# Patient Record
Sex: Female | Born: 1975
Health system: Southern US, Community
[De-identification: ages and names within clinical notes are randomized; demographics above are authoritative.]

## PROBLEM LIST (undated history)

## (undated) DIAGNOSIS — L309 Dermatitis, unspecified: Secondary | ICD-10-CM

## (undated) DIAGNOSIS — R51 Headache: Secondary | ICD-10-CM

## (undated) DIAGNOSIS — J302 Other seasonal allergic rhinitis: Secondary | ICD-10-CM

## (undated) DIAGNOSIS — K429 Umbilical hernia without obstruction or gangrene: Secondary | ICD-10-CM

## (undated) DIAGNOSIS — D649 Anemia, unspecified: Secondary | ICD-10-CM

## (undated) DIAGNOSIS — J45909 Unspecified asthma, uncomplicated: Secondary | ICD-10-CM

## (undated) DIAGNOSIS — R519 Headache, unspecified: Secondary | ICD-10-CM

## (undated) HISTORY — PX: WISDOM TOOTH EXTRACTION: SHX21

## (undated) HISTORY — PX: DILATION AND CURETTAGE OF UTERUS: SHX78

## (undated) HISTORY — PX: COLONOSCOPY: SHX174

## (undated) HISTORY — PX: MYOMECTOMY VAGINAL APPROACH: SUR871

## (undated) HISTORY — PX: ABDOMINAL HYSTERECTOMY: SHX81

## (undated) HISTORY — PX: BREAST SURGERY: SHX581

## (undated) HISTORY — DX: Unspecified asthma, uncomplicated: J45.909

## (undated) HISTORY — PX: LIPOSUCTION TRUNK: SUR833

## (undated) HISTORY — DX: Dermatitis, unspecified: L30.9

---

## 2013-12-07 HISTORY — PX: LASER ABLATION: SHX1947

## 2017-08-13 ENCOUNTER — Ambulatory Visit (INDEPENDENT_AMBULATORY_CARE_PROVIDER_SITE_OTHER): Payer: BLUE CROSS/BLUE SHIELD | Admitting: Allergy

## 2017-08-13 ENCOUNTER — Encounter: Payer: Self-pay | Admitting: Allergy

## 2017-08-13 VITALS — BP 94/66 | HR 67 | Temp 98.1°F | Resp 16 | Ht 62.5 in | Wt 139.8 lb

## 2017-08-13 DIAGNOSIS — Z8709 Personal history of other diseases of the respiratory system: Secondary | ICD-10-CM

## 2017-08-13 DIAGNOSIS — J309 Allergic rhinitis, unspecified: Secondary | ICD-10-CM | POA: Diagnosis not present

## 2017-08-13 DIAGNOSIS — T781XXA Other adverse food reactions, not elsewhere classified, initial encounter: Secondary | ICD-10-CM | POA: Diagnosis not present

## 2017-08-13 DIAGNOSIS — H1010 Acute atopic conjunctivitis, unspecified eye: Secondary | ICD-10-CM | POA: Diagnosis not present

## 2017-08-13 MED ORDER — FLUTICASONE PROPIONATE 50 MCG/ACT NA SUSP: 2.0000 | Freq: Every day | NASAL | 5 refills | Status: DC

## 2017-08-13 MED ORDER — AZELASTINE HCL 0.1 % NA SOLN: 2.0000 | Freq: Two times a day (BID) | NASAL | 5 refills | Status: DC

## 2017-08-13 NOTE — Progress Notes (Signed)
New Patient Note  RE: Jill Elliott MRN: [id number] DOB: 1976/02/15 Date of Office Visit: 08/13/2017  Referring provider: No ref. provider found Primary care provider: Patient, No Pcp Per  Chief Complaint: ear and allergy symptoms  History of present illness: Jill Elliott is a 41 y.o. female presenting today for evaluation of ear and allergy symptoms.  She has been having issues with her right ear feeling clogged as well as nasal congestion and drainage in back of the throat.  She does clear her throat often.  She also reports itchy eyes.  She has been seen in urgent care recently for these symptoms who recommended she use Flonase.  She tries to use "natural" remedies mostly.  She has tried [facility] as needed and Claritin D as needed.  She does feel that when she was using Flonase consistently that she did notice improvement in her symptoms. She just moved here from [place] in April as has noticed that her allergies have been much worse since the move.    She has had allergy testing years ago and she was positive for dust and roaches.  She reports when she used to live in [place] years ago with the season changes she will have increase in her allergy and sinus symptoms and would usually end up needing a antibiotic.  She has not needed an antibiotic for any sinus infections and several years now.  She has a past history of asthma.  She recalls using a inhaler with last use around 2010.   She denies any asthma symptoms currently.    She reports having seb derm in scalp.   She reports with echinacea that her throat itches and she would cough. Thus she avoids echinacea.  With oranges she reports she would break out in bumps around the mouth but able to eat tangerines without any symptoms. With shellfish (shrimp and lobster) she reports she develops itchiness of mouth and lip swelling but she tries to avoid shellfish.    Review of systems:  Review of Systems  Constitutional: Negative for chills,  fever and malaise/fatigue.  HENT: Positive for congestion and ear pain. Negative for ear discharge, hearing loss, nosebleeds, sinus pain, sore throat and tinnitus.   Eyes: Negative for pain, discharge and redness.  Respiratory: Negative for cough, shortness of breath and wheezing.   Cardiovascular: Negative for chest pain.  Gastrointestinal: Negative for abdominal pain, diarrhea, heartburn, nausea and vomiting.  Musculoskeletal: Negative for joint pain.  Skin: Negative for itching and rash.  Neurological: Negative for headaches.    All other systems negative unless noted above in HPI  Past medical history: Past Medical History:  Diagnosis Date  . Asthma   . Eczema     Past surgical history: History reviewed. No pertinent surgical history.  Family history:  Family History  Problem Relation Age of Onset  . Allergic rhinitis Neg Hx   . Angioedema Neg Hx   . Asthma Neg Hx   . Eczema Neg Hx   . Immunodeficiency Neg Hx   . Urticaria Neg Hx     Social history: She lives in a apartment with carpeting with electric heating and central cooling. There are no pets in the home but there are dogs in the other apartments around her.  There is no concern for water damage, mildew or roaches in the home. She is an Secondary school teacher. She has no smoking history.   Medication List: Allergies as of 08/13/2017      Reactions  Penicillins    Chest and stomach pain      Medication List       Accurate as of 08/13/17  4:59 PM. Always use your most recent med list.          fluticasone 50 MCG/ACT nasal spray Commonly known as:  FLONASE USE 1-2 SPRAYS IN THE NOSTRILS DAILY   meclizine 25 MG tablet Commonly known as:  ANTIVERT 1 TABLET BY MOUTH EVERY 6 HOURS AS NEEDED VERTIGO       Known medication allergies: Allergies  Allergen Reactions  . Penicillins     Chest and stomach pain     Physical examination: Blood pressure 94/66, pulse 67, temperature 98.1 F (36.7 C),  temperature source Oral, resp. rate 16, height 5' 2.5" (1.588 m), weight 139 lb 12.8 oz (63.4 kg), SpO2 98 %.  General: Alert, interactive, in no acute distress. HEENT: PERRLA, TMs pearly gray, turbinates moderately edematous with clear discharge, post-pharynx non erythematous. Neck: Supple without lymphadenopathy. Lungs: Clear to auscultation without wheezing, rhonchi or rales. {no increased work of breathing. CV: Normal S1, S2 without murmurs. Abdomen: Nondistended, nontender. Skin: Warm and dry, without lesions or rashes. Extremities:  No clubbing, cyanosis or edema. Neuro:   Grossly intact.  Diagnositics/Labs:  Spirometry: FEV1: 2.07L  89%, FVC: 2.15L  78%, ratio consistent with Restrictive pattern  Allergy testing: Environmental skin prick testing is positive for grass, tree and weed pollen as well as dust mites. Food skin prick testing is negative to orange, shrimp, crab, oyster, lobster, scallops Allergy testing results were read and interpreted by provider, documented by clinical staff.   Assessment and plan:   Allergic rhinoconjunctivitis     -  Allergy testing is positive for grass, weeds, trees, dust mites     - Allergen avoidance measures discussed/provided     - recommend use of Dymista (combination of nasal antihistamine and Flonase) use 1 spray each nostril twice a day.       - once you run out of Dymista (if not covered) use Flonase and Astelin separately.   Flonase 2 sprays each nostril daily and Asteli 2 sprays each nostril twice a day for nasal drainage        - use OTC antihistamine daily (Xyzal 5mg )   Adverse food reaction       - testing for shellfish and orange is negative      - would recommend avoidance due to oral symptoms with ingestion but at this time do not warrant need of an epinephrine device.    Follow-up 6 months or sooner if needed  I appreciate the opportunity to take part in Jill Elliott's care. Please do not hesitate to contact me with  questions.  Sincerely,   Prudy Feeler, MD Allergy/Immunology [facility]

## 2017-08-13 NOTE — Patient Instructions (Signed)
Allergic rhinoconjunctivitis     -  Allergy testing is positive for grass, weeds, trees, dust mites     - Allergen avoidance measures discussed/provided     - recommend use of Dymista (combination of nasal antihistamine and Flonase) use 1 spray each nostril twice a day.       - once you run out of Dymista (if not covered) use Flonase and Astelin separately.   Flonase 2 sprays each nostril daily and Asteli 2 sprays each nostril twice a day for nasal drainage        - use OTC antihistamine daily (Xyzal 5mg )   Adverse food reaction       - testing for shellfish and orange is negative      - would recommend avoidance due to oral symptoms with ingestion but at this time do not warrant need of an epinephrine device.    Follow-up 6 months or sooner if needed

## 2017-08-24 ENCOUNTER — Other Ambulatory Visit (HOSPITAL_COMMUNITY)
Admission: RE | Admit: 2017-08-24 | Discharge: 2017-08-24 | Disposition: A | Discharge status: Home / Self Care | Payer: BLUE CROSS/BLUE SHIELD | Source: Ambulatory Visit | Attending: Family Medicine | Admitting: Family Medicine

## 2017-08-24 ENCOUNTER — Other Ambulatory Visit: Payer: Self-pay | Admitting: Family Medicine

## 2017-08-24 DIAGNOSIS — Z01419 Encounter for gynecological examination (general) (routine) without abnormal findings: Secondary | ICD-10-CM | POA: Insufficient documentation

## 2017-08-25 LAB — CYTOLOGY - PAP: Diagnosis: NEGATIVE

## 2017-11-01 ENCOUNTER — Ambulatory Visit: Payer: BLUE CROSS/BLUE SHIELD | Admitting: Obstetrics & Gynecology

## 2017-11-08 ENCOUNTER — Ambulatory Visit: Payer: BLUE CROSS/BLUE SHIELD | Admitting: Obstetrics & Gynecology

## 2017-11-12 ENCOUNTER — Other Ambulatory Visit: Payer: Self-pay | Admitting: Obstetrics and Gynecology

## 2017-11-16 ENCOUNTER — Encounter (HOSPITAL_COMMUNITY): Payer: Self-pay | Admitting: *Deleted

## 2017-11-16 ENCOUNTER — Encounter (HOSPITAL_COMMUNITY)
Admission: RE | Admit: 2017-11-16 | Discharge: 2017-11-16 | Disposition: A | Discharge status: Home / Self Care | Payer: BC Managed Care – PPO | Source: Ambulatory Visit | Attending: Obstetrics and Gynecology | Admitting: Obstetrics and Gynecology

## 2017-11-16 ENCOUNTER — Other Ambulatory Visit: Payer: Self-pay

## 2017-11-16 DIAGNOSIS — N888 Other specified noninflammatory disorders of cervix uteri: Secondary | ICD-10-CM | POA: Diagnosis not present

## 2017-11-16 DIAGNOSIS — D251 Intramural leiomyoma of uterus: Secondary | ICD-10-CM | POA: Diagnosis not present

## 2017-11-16 DIAGNOSIS — D252 Subserosal leiomyoma of uterus: Secondary | ICD-10-CM | POA: Diagnosis not present

## 2017-11-16 DIAGNOSIS — N838 Other noninflammatory disorders of ovary, fallopian tube and broad ligament: Secondary | ICD-10-CM | POA: Diagnosis not present

## 2017-11-16 DIAGNOSIS — Z91013 Allergy to seafood: Secondary | ICD-10-CM | POA: Diagnosis not present

## 2017-11-16 DIAGNOSIS — N879 Dysplasia of cervix uteri, unspecified: Secondary | ICD-10-CM | POA: Diagnosis not present

## 2017-11-16 DIAGNOSIS — Z880 Allergy status to penicillin: Secondary | ICD-10-CM | POA: Diagnosis not present

## 2017-11-16 DIAGNOSIS — J45909 Unspecified asthma, uncomplicated: Secondary | ICD-10-CM | POA: Diagnosis not present

## 2017-11-16 HISTORY — DX: Headache: R51

## 2017-11-16 HISTORY — DX: Other seasonal allergic rhinitis: J30.2

## 2017-11-16 HISTORY — DX: Anemia, unspecified: D64.9

## 2017-11-16 HISTORY — DX: Umbilical hernia without obstruction or gangrene: K42.9

## 2017-11-16 HISTORY — DX: Headache, unspecified: R51.9

## 2017-11-16 LAB — BASIC METABOLIC PANEL
ANION GAP: 9 (ref 5–15)
BUN: 9 mg/dL (ref 6–20)
CHLORIDE: 104 mmol/L (ref 101–111)
CO2: 24 mmol/L (ref 22–32)
Calcium: 9.5 mg/dL (ref 8.9–10.3)
Creatinine, Ser: 0.7 mg/dL (ref 0.44–1.00)
GFR calc non Af Amer: >60 mL/min (ref >60)
Glucose, Bld: 89 mg/dL (ref 65–99)
POTASSIUM: 4.2 mmol/L (ref 3.5–5.1)
SODIUM: 137 mmol/L (ref 135–145)

## 2017-11-16 LAB — CBC
HEMATOCRIT: 38.5 % (ref 36.0–46.0)
Hemoglobin: 12.4 g/dL (ref 12.0–15.0)
MCH: 29.6 pg (ref 26.0–34.0)
MCHC: 32.2 g/dL (ref 30.0–36.0)
MCV: 91.9 fL (ref 78.0–100.0)
Platelets: 305 10*3/uL (ref 150–400)
RBC: 4.19 MIL/uL (ref 3.87–5.11)
RDW: 13.2 % (ref 11.5–15.5)
WBC: 3.9 10*3/uL — AB (ref 4.0–10.5)

## 2017-11-16 NOTE — Pre-Procedure Instructions (Signed)
SDS BBHistory Log sent to Lab for patient's previous history of blood transfusion at age 41 yrs with back surgery in [place].  2 units transfused.

## 2017-11-16 NOTE — H&P (Signed)
Jill Elliott is a 41 y.o. female, P: 0-0-3-0 who presents for hysterectomy because of symptomatic uterine fibroids.  The patient has had a long history of fibroids that have been managed with hysteroscopic surgical resection and ( for her heavy bleeding)   an endometrial ablation (2015).  Since that time she has continued to have cyclical heavy bleeding that lasts for 6 days and requires both a pad and tampon that she changes every 3 hours.  She goes on to report significant cramping with minimal relief from over the counter analgesia.  In September 2018 she had a normal CBC, Comprehensive Metabolic Panel, Lipid Panel and TSH (1.01).  A pelvic ultrasound on November 10, 2017 showed   an anteverted uterus : 8.07 x 7.93 x 6.69 cm, endometrium: 7.77 mm; right ovary: 3.54 cm and left ovary: 2.93 cm; # 7 fibroids: Fundal: (1) 4.39 cm right lateral; (2) 4.13 cm right posterior sub-serosal; (3) 2.48 cm posterior sub-serosal; (4) 2.26 cm anterior left intramural; (5) 1.90 cm posterior mid-intramural; (6) 4.96 cm left lateral posterior mid-LUS intramural and (7) 2.17 cm left anterior pedunculated.  She denies any changes in bowel or bladder function and is abstinent.   A review of both medical and surgical management options were given to the patient however, given the continuing disruptive nature of her fibroids she has chosen definitive therapy in the form of hysterectomy.  Past Medical History  OB History: G: 4; P: 0-0-3-0;  1 stillbirth  GYN History: menarche: 41 YO    LMP: 10/21/2017    Contracepton abstinence  The patient denies history of sexually transmitted disease.  Denies history of abnormal PAP smear.  Last PAP smear: 2018-normal   Medical History: Eczema, Asthma, Seborrheic Dermatiitis, Diverticular Disease,  Thyroid Disease, Migraine, PMS and Post Partum Depression   Surgical History: Placement of Ear Tubes and Tonsillectomy/Adenoidectomy;  2009 and 2011 Hysteroscopic Removal of Fibroids; 2012  Breast Reduction;  2015 Hysteroscopy with Endometrial Ablation Denies problems with anesthesia or history of blood transfusions  Family History: Thyroid Disease and Bone Cancer  Social History: Single and employed by [employer] in [place]; Denies alcohol or tobacco use  Medications: Cipro 500 mg tid Flagyl 500 mg tid Percocet 5/325  every 6 hours prn Ibuprofen 600 mg with food every 6 hours prn Meclizine 25 mg every 6 hours prn  Allergies  Allergen Reactions  . Penicillins     Chest and stomach pain Has patient had a PCN reaction causing immediate rash, facial/tongue/throat swelling, SOB or lightheadedness with hypotension: Unknown Has patient had a PCN reaction causing severe rash involving mucus membranes or skin necrosis: Unknown Has patient had a PCN reaction that required hospitalization: No Has patient had a PCN reaction occurring within the last 10 years: Unknown If all of the above answers are "NO", then may proceed with Cephalosporin use.    . Shellfish Allergy Swelling and Rash    Lips swell   (Allergy testing by Allergist was negative for Shellfish)     ROS: Admits to fatigue, occasional vertigo, environmental and seasonal allergies, recent treatment for diverticulitis,   but  denies headache, vision changes,  dysphagia, tinnitus, dizziness, hoarseness, cough,  chest pain, shortness of breath, nausea, vomiting, diarrhea,constipation,  urinary frequency, urgency  dysuria, hematuria, vaginitis symptoms,  swelling of joints,easy bruising,  myalgias, arthralgias, skin rashes, unexplained weight loss and except as is mentioned in the history of present illness, patient's review of systems is otherwise negative.     Physical Exam  Bp: 112/60  P: 84 bpm   R: 20   Temperature: 98.1 degrees F orally     Weight:  142 lbs.   Height: 5' 2.5 "  BMI: 25.6  Neck: supple without masses or thyromegaly Lungs: clear to auscultation Heart: regular rate and rhythm Abdomen: soft,  non-tender and no organomegaly Pelvic:EGBUS- wnl; vagina-normal rugae; uterus 14 weeks  size and irregular, cervix without lesions or motion tenderness; adnexae-no tenderness or masses Extremities:  no clubbing, cyanosis or edema   Assesment: Symptomatic Uterine Fibroids                       Dysmenorrhea   Disposition:  A discussion was held with patient regarding the indication for her procedure(s).  In particular, Robotically-assisted hysterectomy was reviewed with the patient.   Benefits of the robotic approach include lesser postoperative pain, less blood loss during surgery, reduced risk of injury to other organs due to better visualization with a 3-D HD 10 times magnifying camera, shorter hospital stay between 0-1 night and rapid recovery with return to daily routine in 2-3 weeks. Although robotically-assisted hysterectomy has a longer operative time than traditional laparotomy, in a patient with good medical history, the benefits usually outweigh the risks.  Risks include bleeding, infection, injury to other organs, need for laparotomy, transient post-operative facial edema, increased risk of pelvic prolapse associated with any hysterectomy as well as earlier onset of menopause. Preservation or preventative removal of the ovaries was also reviewed and left to the patient's discretion. Finally, the option of supracervical hysterectomy was also discussed with the possible but yet unconfirmed benefits of reduction of pelvic prolapse. If supracervical hysterectomy is performed, Pap smear screening would continue as currently recommended, monthly bleeding is possible despite cauterization of cervical canal and a small but possible risk of cervical fibroid development is present.  Patient informed about FDA warning on use of morcellator dated 03/23/2013.  Discussed:  1. Incidence of post-operative diagnosis of sarcoma in women undergoing a hysterectomy is 2:1000  2. Annual incidence of  leiomyosarcoma is 0.64/100,000 women  3. Sarcomas have the highest incidence in women over 65  4. Power morcellation involves risks of spreading tissue / disease. In he case of undiagnosed cancer, it may adversely     affect the patient's prognosis.  Patient voices understanding and desires to proceed as planned  All questions were answered. Bowel preparation info given and reviewed. The patient has consented to a Robot Assisted Total Laparoscopic Hysterectomy with Bilateral Salpingectomy at [facility] on November 18, 2017 at 7:30 a.m.   CSN# [id number]   Renika Shiflet J. Florene Glen, PA-C  for Dr. Dede Query.Rivard

## 2017-11-16 NOTE — Patient Instructions (Addendum)
Your procedure is scheduled on:  Thursday, Dec 13  Enter through the [employer] [facility] at:  6 am  Pick up the phone at the desk and dial [phone].  Call this number if you have problems the morning of surgery: [phone].  Remember: Do NOT eat food or Do NOT drink clear liquids (including water) after midnight Wednesday  Take these medicines the morning of surgery with a SIP OF WATER:   None  Stop herbal medications and supplements at this time.  Do NOT wear jewelry (body piercing), metal hair clips/bobby pins, make-up, or nail polish. Do NOT wear lotions, powders, or perfumes.  You may wear deoderant. Do NOT shave for 48 hours prior to surgery. Do NOT bring valuables to the hospital.   Leave suitcase in car.  After surgery it may be brought to your room.  For patients admitted to the hospital, checkout time is 11:00 AM the day of discharge. Have a responsible adult drive you home and stay with you for 24 hours after your procedure.  Home with Lorene Dy cell [phone].

## 2017-11-18 ENCOUNTER — Encounter (HOSPITAL_COMMUNITY)
Admission: RE | Disposition: A | Discharge status: Home / Self Care | Payer: Self-pay | Source: Ambulatory Visit | Attending: Obstetrics and Gynecology

## 2017-11-18 ENCOUNTER — Encounter (HOSPITAL_COMMUNITY): Payer: Self-pay | Admitting: Emergency Medicine

## 2017-11-18 ENCOUNTER — Ambulatory Visit (HOSPITAL_COMMUNITY): Payer: BC Managed Care – PPO | Admitting: Anesthesiology

## 2017-11-18 ENCOUNTER — Other Ambulatory Visit: Payer: Self-pay

## 2017-11-18 ENCOUNTER — Ambulatory Visit (HOSPITAL_COMMUNITY)
Admission: RE | Admit: 2017-11-18 | Discharge: 2017-11-18 | Disposition: A | Discharge status: Home / Self Care | Payer: BC Managed Care – PPO | Source: Ambulatory Visit | Attending: Obstetrics and Gynecology | Admitting: Obstetrics and Gynecology

## 2017-11-18 DIAGNOSIS — Z91013 Allergy to seafood: Secondary | ICD-10-CM | POA: Insufficient documentation

## 2017-11-18 DIAGNOSIS — D251 Intramural leiomyoma of uterus: Secondary | ICD-10-CM | POA: Diagnosis not present

## 2017-11-18 DIAGNOSIS — N879 Dysplasia of cervix uteri, unspecified: Secondary | ICD-10-CM | POA: Insufficient documentation

## 2017-11-18 DIAGNOSIS — N838 Other noninflammatory disorders of ovary, fallopian tube and broad ligament: Secondary | ICD-10-CM | POA: Insufficient documentation

## 2017-11-18 DIAGNOSIS — D259 Leiomyoma of uterus, unspecified: Secondary | ICD-10-CM | POA: Diagnosis present

## 2017-11-18 DIAGNOSIS — N888 Other specified noninflammatory disorders of cervix uteri: Secondary | ICD-10-CM | POA: Insufficient documentation

## 2017-11-18 DIAGNOSIS — J45909 Unspecified asthma, uncomplicated: Secondary | ICD-10-CM | POA: Insufficient documentation

## 2017-11-18 DIAGNOSIS — D252 Subserosal leiomyoma of uterus: Secondary | ICD-10-CM | POA: Insufficient documentation

## 2017-11-18 DIAGNOSIS — Z880 Allergy status to penicillin: Secondary | ICD-10-CM | POA: Insufficient documentation

## 2017-11-18 HISTORY — PX: ROBOTIC ASSISTED TOTAL HYSTERECTOMY WITH SALPINGECTOMY: SHX6679

## 2017-11-18 LAB — PREGNANCY, URINE: Preg Test, Ur: NEGATIVE

## 2017-11-18 SURGERY — ROBOTIC ASSISTED TOTAL HYSTERECTOMY WITH SALPINGECTOMY
Anesthesia: General | Site: Abdomen | Laterality: Bilateral

## 2017-11-18 MED ORDER — STERILE WATER FOR IRRIGATION IR SOLN
Status: DC | PRN
  Administered 2017-11-18 (×2): 200 mL via INTRAVESICAL

## 2017-11-18 MED ORDER — SODIUM CHLORIDE 0.9 % IJ SOLN
INTRAMUSCULAR | Status: AC
  Filled 2017-11-18: qty 50

## 2017-11-18 MED ORDER — KETOROLAC TROMETHAMINE 30 MG/ML IJ SOLN
INTRAMUSCULAR | Status: DC | PRN
  Administered 2017-11-18: 30 mg via INTRAVENOUS

## 2017-11-18 MED ORDER — HYDROMORPHONE HCL 1 MG/ML IJ SOLN
INTRAMUSCULAR | Status: DC | PRN
  Administered 2017-11-18: 1 mg via INTRAVENOUS

## 2017-11-18 MED ORDER — PROPOFOL 10 MG/ML IV BOLUS
INTRAVENOUS | Status: AC
  Filled 2017-11-18: qty 20

## 2017-11-18 MED ORDER — SCOPOLAMINE 1 MG/3DAYS TD PT72
1.0000 | MEDICATED_PATCH | Freq: Once | TRANSDERMAL | Status: DC
  Administered 2017-11-18: 1.5 mg via TRANSDERMAL

## 2017-11-18 MED ORDER — KETOROLAC TROMETHAMINE 30 MG/ML IJ SOLN: 30.0000 mg | Freq: Once | INTRAMUSCULAR | Status: DC

## 2017-11-18 MED ORDER — DEXAMETHASONE SODIUM PHOSPHATE 10 MG/ML IJ SOLN
INTRAMUSCULAR | Status: AC
  Filled 2017-11-18: qty 1

## 2017-11-18 MED ORDER — ROPIVACAINE HCL 5 MG/ML IJ SOLN
INTRAMUSCULAR | Status: AC
  Filled 2017-11-18: qty 30

## 2017-11-18 MED ORDER — ARTIFICIAL TEARS OPHTHALMIC OINT
TOPICAL_OINTMENT | OPHTHALMIC | Status: DC | PRN
  Administered 2017-11-18: 1 via OPHTHALMIC

## 2017-11-18 MED ORDER — PROMETHAZINE HCL 25 MG/ML IJ SOLN: 6.2500 mg | INTRAMUSCULAR | Status: DC | PRN

## 2017-11-18 MED ORDER — MIDAZOLAM HCL 2 MG/2ML IJ SOLN
INTRAMUSCULAR | Status: AC
  Filled 2017-11-18: qty 2

## 2017-11-18 MED ORDER — LIDOCAINE HCL (CARDIAC) 20 MG/ML IV SOLN
INTRAVENOUS | Status: AC
  Filled 2017-11-18: qty 5

## 2017-11-18 MED ORDER — IBUPROFEN 600 MG PO TABS: 600.0000 mg | ORAL_TABLET | Freq: Four times a day (QID) | ORAL | Status: DC | PRN

## 2017-11-18 MED ORDER — CLINDAMYCIN PHOSPHATE 900 MG/50ML IV SOLN
900.0000 mg | INTRAVENOUS | Status: AC
  Administered 2017-11-18: 900 mg via INTRAVENOUS
  Filled 2017-11-18: qty 50

## 2017-11-18 MED ORDER — FENTANYL CITRATE (PF) 100 MCG/2ML IJ SOLN
INTRAMUSCULAR | Status: AC
  Filled 2017-11-18: qty 2

## 2017-11-18 MED ORDER — SCOPOLAMINE 1 MG/3DAYS TD PT72
MEDICATED_PATCH | TRANSDERMAL | Status: AC
  Administered 2017-11-18: 1.5 mg via TRANSDERMAL
  Filled 2017-11-18: qty 1

## 2017-11-18 MED ORDER — KETOROLAC TROMETHAMINE 30 MG/ML IJ SOLN
INTRAMUSCULAR | Status: AC
  Filled 2017-11-18: qty 1

## 2017-11-18 MED ORDER — CIPROFLOXACIN IN D5W 400 MG/200ML IV SOLN
400.0000 mg | INTRAVENOUS | Status: AC
  Administered 2017-11-18: 400 mg via INTRAVENOUS
  Filled 2017-11-18: qty 200

## 2017-11-18 MED ORDER — ROPIVACAINE HCL 5 MG/ML IJ SOLN
INTRAMUSCULAR | Status: AC
  Filled 2017-11-18: qty 60

## 2017-11-18 MED ORDER — PROPOFOL 10 MG/ML IV BOLUS
INTRAVENOUS | Status: DC | PRN
  Administered 2017-11-18: 20 mg via INTRAVENOUS
  Administered 2017-11-18: 150 mg via INTRAVENOUS
  Administered 2017-11-18: 30 mg via INTRAVENOUS

## 2017-11-18 MED ORDER — LACTATED RINGERS IV SOLN
INTRAVENOUS | Status: DC
  Administered 2017-11-18 (×3): via INTRAVENOUS

## 2017-11-18 MED ORDER — ONDANSETRON HCL 4 MG PO TABS
4.0000 mg | ORAL_TABLET | Freq: Three times a day (TID) | ORAL | Status: DC | PRN
  Administered 2017-11-18: 4 mg via ORAL
  Filled 2017-11-18: qty 1

## 2017-11-18 MED ORDER — FENTANYL CITRATE (PF) 250 MCG/5ML IJ SOLN
INTRAMUSCULAR | Status: DC | PRN
  Administered 2017-11-18 (×2): 50 ug via INTRAVENOUS
  Administered 2017-11-18: 100 ug via INTRAVENOUS
  Administered 2017-11-18 (×2): 50 ug via INTRAVENOUS
  Administered 2017-11-18 (×2): 100 ug via INTRAVENOUS

## 2017-11-18 MED ORDER — LIDOCAINE HCL (CARDIAC) 20 MG/ML IV SOLN
INTRAVENOUS | Status: DC | PRN
  Administered 2017-11-18: 40 mg via INTRAVENOUS

## 2017-11-18 MED ORDER — FENTANYL CITRATE (PF) 250 MCG/5ML IJ SOLN
INTRAMUSCULAR | Status: AC
  Filled 2017-11-18: qty 5

## 2017-11-18 MED ORDER — ALBUMIN HUMAN 5 % IV SOLN
INTRAVENOUS | Status: AC
  Filled 2017-11-18: qty 250

## 2017-11-18 MED ORDER — FENTANYL CITRATE (PF) 100 MCG/2ML IJ SOLN
25.0000 ug | INTRAMUSCULAR | Status: DC | PRN
  Administered 2017-11-18 (×2): 50 ug via INTRAVENOUS

## 2017-11-18 MED ORDER — ONDANSETRON HCL 4 MG/2ML IJ SOLN
INTRAMUSCULAR | Status: AC
  Filled 2017-11-18: qty 2

## 2017-11-18 MED ORDER — SUGAMMADEX SODIUM 200 MG/2ML IV SOLN
INTRAVENOUS | Status: DC | PRN
Start: 2017-11-18 — End: 2017-11-18
  Administered 2017-11-18: 200 mg via INTRAVENOUS

## 2017-11-18 MED ORDER — SODIUM CHLORIDE 0.9 % IV SOLN
INTRAVENOUS | Status: DC | PRN
  Administered 2017-11-18: 60 mL
  Administered 2017-11-18: 10 mL
  Administered 2017-11-18: 50 mL

## 2017-11-18 MED ORDER — ROCURONIUM BROMIDE 100 MG/10ML IV SOLN
INTRAVENOUS | Status: DC | PRN
  Administered 2017-11-18: 20 mg via INTRAVENOUS
  Administered 2017-11-18: 40 mg via INTRAVENOUS
  Administered 2017-11-18 (×2): 20 mg via INTRAVENOUS

## 2017-11-18 MED ORDER — SODIUM CHLORIDE 0.9 % IJ SOLN
INTRAMUSCULAR | Status: AC
  Filled 2017-11-18: qty 10

## 2017-11-18 MED ORDER — SODIUM CHLORIDE 0.9 % IR SOLN
Status: DC | PRN
  Administered 2017-11-18: 3000 mL

## 2017-11-18 MED ORDER — HYDROMORPHONE HCL 1 MG/ML IJ SOLN
INTRAMUSCULAR | Status: AC
  Filled 2017-11-18: qty 1

## 2017-11-18 MED ORDER — MECLIZINE HCL 25 MG PO TABS
25.0000 mg | ORAL_TABLET | Freq: Once | ORAL | Status: AC
  Administered 2017-11-18: 25 mg via ORAL
  Filled 2017-11-18: qty 1

## 2017-11-18 MED ORDER — SUGAMMADEX SODIUM 200 MG/2ML IV SOLN
INTRAVENOUS | Status: AC
  Filled 2017-11-18: qty 2

## 2017-11-18 MED ORDER — LACTATED RINGERS IV SOLN
INTRAVENOUS | Status: DC
  Administered 2017-11-18: 15:00:00 via INTRAVENOUS

## 2017-11-18 MED ORDER — OXYCODONE-ACETAMINOPHEN 5-325 MG PO TABS
1.0000 | ORAL_TABLET | ORAL | Status: DC | PRN
  Administered 2017-11-18: 1 via ORAL
  Filled 2017-11-18: qty 1

## 2017-11-18 MED ORDER — MENTHOL 3 MG MT LOZG: 1.0000 | LOZENGE | OROMUCOSAL | Status: DC | PRN

## 2017-11-18 MED ORDER — DEXAMETHASONE SODIUM PHOSPHATE 10 MG/ML IJ SOLN
INTRAMUSCULAR | Status: DC | PRN
Start: 2017-11-18 — End: 2017-11-18
  Administered 2017-11-18: 10 mg via INTRAVENOUS

## 2017-11-18 MED ORDER — ALBUMIN HUMAN 5 % IV SOLN
INTRAVENOUS | Status: DC | PRN
  Administered 2017-11-18: 09:00:00 via INTRAVENOUS

## 2017-11-18 MED ORDER — GLYCOPYRROLATE 0.2 MG/ML IJ SOLN
INTRAMUSCULAR | Status: AC
  Filled 2017-11-18: qty 4

## 2017-11-18 MED ORDER — MIDAZOLAM HCL 5 MG/5ML IJ SOLN
INTRAMUSCULAR | Status: DC | PRN
  Administered 2017-11-18: 2 mg via INTRAVENOUS

## 2017-11-18 SURGICAL SUPPLY — 50 items
BARRIER ADHS 3X4 INTERCEED (GAUZE/BANDAGES/DRESSINGS) ×3 IMPLANT
CATH FOLEY 3WAY  5CC 16FR (CATHETERS) ×2
CATH FOLEY 3WAY 5CC 16FR (CATHETERS) ×1 IMPLANT
CLOTH BEACON ORANGE TIMEOUT ST (SAFETY) ×3 IMPLANT
CONT PATH 16OZ SNAP LID 3702 (MISCELLANEOUS) ×3 IMPLANT
COVER BACK TABLE 60X90IN (DRAPES) ×6 IMPLANT
COVER TIP SHEARS 8 DVNC (MISCELLANEOUS) ×1 IMPLANT
COVER TIP SHEARS 8MM DA VINCI (MISCELLANEOUS) ×2
DECANTER SPIKE VIAL GLASS SM (MISCELLANEOUS) ×6 IMPLANT
DERMABOND ADVANCED (GAUZE/BANDAGES/DRESSINGS) ×2
DERMABOND ADVANCED .7 DNX12 (GAUZE/BANDAGES/DRESSINGS) ×1 IMPLANT
DURAPREP 26ML APPLICATOR (WOUND CARE) ×3 IMPLANT
ELECT REM PT RETURN 9FT ADLT (ELECTROSURGICAL) ×3
ELECTRODE REM PT RTRN 9FT ADLT (ELECTROSURGICAL) ×1 IMPLANT
GLOVE BIO SURGEON STRL SZ7 (GLOVE) ×3 IMPLANT
GLOVE BIOGEL PI IND STRL 7.0 (GLOVE) ×5 IMPLANT
GLOVE BIOGEL PI INDICATOR 7.0 (GLOVE) ×10
GLOVE ECLIPSE 6.5 STRL STRAW (GLOVE) ×9 IMPLANT
KIT ACCESSORY DA VINCI DISP (KITS) ×2
KIT ACCESSORY DVNC DISP (KITS) ×1 IMPLANT
LEGGING LITHOTOMY PAIR STRL (DRAPES) ×3 IMPLANT
OCCLUDER COLPOPNEUMO (BALLOONS) ×3 IMPLANT
PACK ROBOT WH (CUSTOM PROCEDURE TRAY) ×3 IMPLANT
PACK ROBOTIC GOWN (GOWN DISPOSABLE) ×3 IMPLANT
PACK TRENDGUARD 450 HYBRID PRO (MISCELLANEOUS) ×1 IMPLANT
PAD PREP 24X48 CUFFED NSTRL (MISCELLANEOUS) ×3 IMPLANT
PORT ACCESS TROCAR AIRSEAL 12 (TROCAR) ×1 IMPLANT
PORT ACCESS TROCAR AIRSEAL 5M (TROCAR) ×2
PROTECTOR NERVE ULNAR (MISCELLANEOUS) ×9 IMPLANT
SET CYSTO W/LG BORE CLAMP LF (SET/KITS/TRAYS/PACK) ×3 IMPLANT
SET IRRIG TUBING LAPAROSCOPIC (IRRIGATION / IRRIGATOR) ×3 IMPLANT
SET TRI-LUMEN FLTR TB AIRSEAL (TUBING) ×3 IMPLANT
SUT MNCRL AB 3-0 PS2 27 (SUTURE) ×6 IMPLANT
SUT VIC AB 0 CT1 27 (SUTURE) ×4
SUT VIC AB 0 CT1 27XBRD ANBCTR (SUTURE) ×2 IMPLANT
SUT VICRYL 0 UR6 27IN ABS (SUTURE) ×3 IMPLANT
SUT VLOC 180 0 9IN  GS21 (SUTURE) ×4
SUT VLOC 180 0 9IN GS21 (SUTURE) ×2 IMPLANT
SYSTEM CONVERTIBLE TROCAR (TROCAR) ×3 IMPLANT
TIP RUMI ORANGE 6.7MMX12CM (TIP) IMPLANT
TIP UTERINE 5.1X6CM LAV DISP (MISCELLANEOUS) IMPLANT
TIP UTERINE 6.7X10CM GRN DISP (MISCELLANEOUS) IMPLANT
TIP UTERINE 6.7X6CM WHT DISP (MISCELLANEOUS) IMPLANT
TIP UTERINE 6.7X8CM BLUE DISP (MISCELLANEOUS) ×3 IMPLANT
TOWEL OR 17X24 6PK STRL BLUE (TOWEL DISPOSABLE) ×9 IMPLANT
TRENDGUARD 450 HYBRID PRO PACK (MISCELLANEOUS) ×3
TROCAR 12M 150ML BLUNT (TROCAR) ×3 IMPLANT
TROCAR DISP BLADELESS 8 DVNC (TROCAR) ×1 IMPLANT
TROCAR DISP BLADELESS 8MM (TROCAR) ×2
TROCAR PORT AIRSEAL 8X120 (TROCAR) IMPLANT

## 2017-11-18 NOTE — Anesthesia Postprocedure Evaluation (Signed)
Anesthesia Post Note  Patient: Jill Elliott  Procedure(s) Performed: ROBOTIC ASSISTED TOTAL HYSTERECTOMY WITH SALPINGECTOMY (Bilateral Abdomen)     Patient location during evaluation: PACU Anesthesia Type: General Level of consciousness: awake and alert Pain management: pain level controlled Vital Signs Assessment: post-procedure vital signs reviewed and stable Respiratory status: spontaneous breathing, nonlabored ventilation, respiratory function stable and patient connected to nasal cannula oxygen Cardiovascular status: blood pressure returned to baseline and stable Postop Assessment: no apparent nausea or vomiting Anesthetic complications: no Comments: Flipped T waves on EKG in PACU.  Resolved after 5 minutes of monitoring.  Patient denies any cardiac history, denies CP, SOB.    Last Vitals:  Vitals:   11/18/17 1245 11/18/17 1255  BP: 135/82 127/86  Pulse: 77 85  Resp: 11 16  Temp:  36.5 C  SpO2: 100% 95%    Last Pain:  Vitals:   11/18/17 1300  TempSrc:   PainSc: 0-No pain   Pain Goal:                 [name]

## 2017-11-18 NOTE — Discharge Instructions (Signed)
Call [facility] @ [phone] if: ° °You have a temperature greater than or equal to 100.4 degrees Farenheit orally °You have pain that is not made better by the pain medication given and taken as directed °You have excessive bleeding or problems urinating ° °Take Colace (Docusate Sodium/Stool Softener) 100 mg 2-3 times daily while taking narcotic pain medicine to avoid constipation or until bowel movements are regular. °Take Ibuprofen 600 mg with food every 6 hours for 5 days  then as needed for pain ° °You may drive after 1 week °You may walk up steps ° °You may shower tomorrow °You may resume a regular diet ° °Keep incisions clean and dry °Do not lift over 15 pounds for 6 weeks ° °Avoid anything in vagina for 6 weeks  ° °

## 2017-11-18 NOTE — Anesthesia Procedure Notes (Signed)
Procedure Name: Intubation Date/Time: 11/18/2017 7:42 AM Performed by: Ignacia Bayley, CRNA Pre-anesthesia Checklist: Patient identified, Patient being monitored, Timeout performed, Emergency Drugs available and Suction available Patient Re-evaluated:Patient Re-evaluated prior to induction Oxygen Delivery Method: Circle System Utilized Preoxygenation: Pre-oxygenation with 100% oxygen Induction Type: IV induction Ventilation: Mask ventilation without difficulty Laryngoscope Size: Miller and 2 Grade View: Grade II Tube type: Oral Tube size: 7.0 mm Number of attempts: 1 Airway Equipment and Method: stylet Placement Confirmation: ETT inserted through vocal cords under direct vision,  positive ETCO2 and breath sounds checked- equal and bilateral Secured at: 20 cm Tube secured with: Tape Dental Injury: Teeth and Oropharynx as per pre-operative assessment

## 2017-11-18 NOTE — Transfer of Care (Signed)
Immediate Anesthesia Transfer of Care Note  Patient: Jill Elliott  Procedure(s) Performed: ROBOTIC ASSISTED TOTAL HYSTERECTOMY WITH SALPINGECTOMY (Bilateral Abdomen)  Patient Location: PACU  Anesthesia Type:General  Level of Consciousness: awake  Airway & Oxygen Therapy: Patient Spontanous Breathing and Patient connected to nasal cannula oxygen  Post-op Assessment: Report given to RN and Post -op Vital signs reviewed and stable  Post vital signs: stable  Last Vitals:  Vitals:   11/18/17 0613  BP: 117/78  Pulse: 80  Resp: 16  Temp: 36.7 C  SpO2: 100%    Last Pain:  Vitals:   11/18/17 0613  TempSrc: Oral  PainSc: 5          Complications: No apparent anesthesia complications

## 2017-11-18 NOTE — Interval H&P Note (Signed)
History and Physical Interval Note:  11/18/2017 7:19 AM  Jill Elliott  has presented today for surgery, with the diagnosis of Dysmenorrhea  The various methods of treatment have been discussed with the patient and family. After consideration of risks, benefits and other options for treatment, the patient has consented to  Procedure(s): ROBOTIC ASSISTED TOTAL HYSTERECTOMY WITH SALPINGECTOMY (Bilateral) as a surgical intervention .  The patient's history has been reviewed, patient examined, no change in status, stable for surgery.  I have reviewed the patient's chart and labs.  Questions were answered to the patient's satisfaction.     Katharine Look A [employer]

## 2017-11-18 NOTE — Op Note (Signed)
Preoperative diagnosis: uterine fibroids  Postoperative diagnosis: Same   Anesthesia: General  Anesthesiologist: Dr. Gifford Shave  Procedure: Robotically assisted total hysterectomy with bilateral salpingectomy  Surgeon: Dr. Katharine Look Yarielys Beed  Assistant: Earnstine Regal P.A.-C.  Estimated blood loss: 100 cc  Procedure:  After being informed of the planned procedure with possible complications including but not limited to bleeding, infection, injury to other organs, need for laparotomy, possible need for morcellation with risks and benefits reviewed, expected hospital stay and recovery, informed consent is obtained and patient is taken to or #7. She is placed in  lithotomy position on Trengard with both arms padded and tucked on each side and bilateral knee-high sequential compressive devices. She is given general anesthesia with endotracheal intubation without any complication. She is prepped and draped in a sterile fashion. A three-way Foley catheter is inserted in her bladder.  Pelvic exam reveals: 16 week size uterus with a dominant right posterior fibroid. Mobility is good.  A weighted speculum is inserted in the vagina and the anterior lip of the cervix is grasped with a tenaculum forcep. We proceed with a paracervical block and vaginal infiltration using ropivacaine 0.5% diluted 1 in 1 with saline. The uterus was then sounded at 8 cm. We easily dilate the cervix using Hegar dilator to  #27 which allows for easy placement of the intrauterine RUMI manipulator with a 3.5 KOH ring and a vaginal occluder. The ring is sutured to the cervix with 0 Vicryl.  Trocar placement is decided. We infiltrate 3 cm above the umbilicus with 10 cc of ropivacaine per protocol and perform a 10 mm vertical incision which is brought down bluntly to the fascia. The fascia is identified and grasped with Coker forceps. The fascia is incised with Mayo scissors. Peritoneum is entered bluntly. A pursestring suture of 0 Vicryl is  placed on the fascia and a 10 mm Hassan trocar is easily inserted in the abdominal cavity held in placed with a Purstring suture. This allows for easy insufflation of a pneumoperitoneum using warmed CO2 at a maximum pressure of 15 mm of mercury. 60 cc of Ropivacaine 0.5 % diluted 1 in 1 is sent in the pelvis and the patient is positioned in reverse Trendelenburg. We then placed two 35mm robotic trocar on the left, one 61mm robotic trocar on the right and one 10 mm patient's side assistant trocar on the right  after infiltrating every site  with ropivacaine per protocol. The robot is docked on the left of the patient after positioning her in Trendelenburg. A monopolar scissor is inserted in arm #1, a PK gyrus forcep is inserted in arm #2 and a Prograsp is inserted in arm #3.  Preparation and docking is completed in 45 minutes.  Observation: Uterus is involved with multiple fibroids with size compatible with 16 weeks. Both tubes and ovaries are normal. Anterior and posterior cul-de-sac are normal. Liver, gallbladder and appendix are normal.  We start on the right side by cauterizing the mesosalpynx , the right utero-ovarian ligament and the right round ligament . This is then sectioned with monopolar scissors. This gives [place] entry into the retroperitoneal space with an easy dissection of the anterior broad ligament. This was opened all the way to the left round ligament.   We then proceed with systematic dissection of the bladder from the anterior vaginal cuff which is easily identified with the KOH ring. The plane of dissection is easily identified and confirmed after filling the bladder with 200 cc of saline. We are  able to dissect the bladder 2 cm below the KOH ring. We then opened the posterior right broad ligament all the way to the posterior KOH ring after identifying the full course of the right ureter.   Moving to the left side we cauterize the left round ligament , the left utero-ovarian ligament and   the mesosalpinx in between. This pedicle is the sectionned. Entry into the retroperitoneal space allows [place] to complete dissection of the bladder on the left side and skeletonized the all uterine vessels. The left broad ligament is then dissected all the way to the posterior KOH ring after identifying the full course of the left ureter.   With pressure on the KOH ring and the bladder fully dissected below we are able to cauterize the uterine vessels on both sides at the level of the KOH ring. Both tubes are then freed after cauterizing the mesosalpynx.  The vaginal occluder is inflated and we proceed with a 360 colpotomy using an open monopolar scissors and freeing the uterus entirely with both tubes.  The uterus is delivered vaginally . The vaginal occluder is reinserted in the vagina to maintain pneumoperitoneum.  Instruments are then modified for a suture cut in arm #1 and a long tip forcep in arm #2. We proceed with closure of the vaginal cuff with 2 running sutures of 0 V-Lock.We irrigated profusely with warm saline and confirm a satisfactory hemostasis as well as 2 normal ureters with good mobility and no dilatation.Two 1/2 sheets of Interceed are placed on the vaginal cuff.  All instruments are then removed and the robot is undocked. Console time: 2 hours and 35 minutes.  All trochars are removed under direct visualization after evacuating the pneumoperitoneum.  The fascia of the supraumbilical incision is closed with the previously placed pursestring suture of 0 Vicryl. All incisions are then closed with subcuticular suture of 3-0 Monocryl and Dermabond.  A speculum is inserted in the vagina to confirm a adequate closure of the vaginal cuff and good hemostasis.  Instrument and sponge count is complete x2. Estimated blood loss is 100 cc. The procedure is well tolerated by the patient who is taken to recovery room in a well and stable condition.  Specimen: Uterus and tubes weighing 355 g  to pathology

## 2017-11-18 NOTE — Progress Notes (Signed)
Patient discharged home with friend. Discharge teaching, home care, medications, and follow-up appts reviewed. Pt verbalized understanding.

## 2017-11-18 NOTE — Anesthesia Preprocedure Evaluation (Addendum)
Anesthesia Evaluation  Patient identified by MRN, date of birth, ID band Patient awake    Reviewed: Allergy & Precautions, NPO status , Patient's Chart, lab work & pertinent test results  Airway Mallampati: II  TM Distance: >3 FB Neck ROM: Full    Dental  (+) Teeth Intact, Dental Advisory Given, Chipped,    Pulmonary asthma ,    Pulmonary exam normal breath sounds clear to auscultation       Cardiovascular negative cardio ROS Normal cardiovascular exam Rhythm:Regular Rate:Normal     Neuro/Psych  Headaches, negative psych ROS   GI/Hepatic negative GI ROS, Neg liver ROS,   Endo/Other  negative endocrine ROS  Renal/GU negative Renal ROS     Musculoskeletal negative musculoskeletal ROS (+)   Abdominal   Peds  Hematology negative hematology ROS (+)   Anesthesia Other Findings Day of surgery medications reviewed with the patient.  Reproductive/Obstetrics Dysmenorrhea                            Anesthesia Physical Anesthesia Plan  ASA: II  Anesthesia Plan: General   Post-op Pain Management:    Induction: Intravenous  PONV Risk Score and Plan: 4 or greater and Scopolamine patch - Pre-op, Midazolam, Dexamethasone and Ondansetron  Airway Management Planned: Oral ETT  Additional Equipment:   Intra-op Plan:   Post-operative Plan: Extubation in OR  Informed Consent: I have reviewed the patients History and Physical, chart, labs and discussed the procedure including the risks, benefits and alternatives for the proposed anesthesia with the patient or authorized representative who has indicated his/her understanding and acceptance.   Dental advisory given  Plan Discussed with: CRNA, Anesthesiologist and Surgeon  Anesthesia Plan Comments: (Risks/benefits of general anesthesia discussed with patient including risk of damage to teeth, lips, gum, and tongue, nausea/vomiting, allergic reactions  to medications, and the possibility of heart attack, stroke and death.  All patient questions answered.  Patient wishes to proceed.)        Anesthesia Quick Evaluation

## 2017-11-19 ENCOUNTER — Encounter (HOSPITAL_COMMUNITY): Payer: Self-pay | Admitting: Obstetrics and Gynecology

## 2017-11-27 ENCOUNTER — Inpatient Hospital Stay (HOSPITAL_COMMUNITY)
Admission: AD | Admit: 2017-11-27 | Discharge: 2017-11-28 | Disposition: A | Discharge status: Home / Self Care | Payer: BC Managed Care – PPO | Source: Ambulatory Visit | Attending: Obstetrics and Gynecology | Admitting: Obstetrics and Gynecology

## 2017-11-27 ENCOUNTER — Other Ambulatory Visit: Payer: Self-pay

## 2017-11-27 ENCOUNTER — Encounter (HOSPITAL_COMMUNITY): Payer: Self-pay | Admitting: *Deleted

## 2017-11-27 DIAGNOSIS — Z880 Allergy status to penicillin: Secondary | ICD-10-CM | POA: Diagnosis not present

## 2017-11-27 DIAGNOSIS — T819XXA Unspecified complication of procedure, initial encounter: Secondary | ICD-10-CM | POA: Insufficient documentation

## 2017-11-27 DIAGNOSIS — R109 Unspecified abdominal pain: Secondary | ICD-10-CM | POA: Insufficient documentation

## 2017-11-27 DIAGNOSIS — X58XXXA Exposure to other specified factors, initial encounter: Secondary | ICD-10-CM | POA: Insufficient documentation

## 2017-11-27 DIAGNOSIS — Z90710 Acquired absence of both cervix and uterus: Secondary | ICD-10-CM | POA: Insufficient documentation

## 2017-11-27 DIAGNOSIS — R1013 Epigastric pain: Secondary | ICD-10-CM

## 2017-11-27 DIAGNOSIS — Z91013 Allergy to seafood: Secondary | ICD-10-CM | POA: Insufficient documentation

## 2017-11-27 LAB — CBC
HCT: 35.1 % — ABNORMAL LOW (ref 36.0–46.0)
Hemoglobin: 11.8 g/dL — ABNORMAL LOW (ref 12.0–15.0)
MCH: 29.8 pg (ref 26.0–34.0)
MCHC: 33.6 g/dL (ref 30.0–36.0)
MCV: 88.6 fL (ref 78.0–100.0)
Platelets: 370 10*3/uL (ref 150–400)
RBC: 3.96 MIL/uL (ref 3.87–5.11)
RDW: 13.2 % (ref 11.5–15.5)
WBC: 7.8 10*3/uL (ref 4.0–10.5)

## 2017-11-27 MED ORDER — GI COCKTAIL ~~LOC~~
30.0000 mL | Freq: Once | ORAL | Status: AC
  Administered 2017-11-27: 30 mL via ORAL
  Filled 2017-11-27: qty 30

## 2017-11-27 NOTE — MAU Note (Addendum)
Was eating dinner at [employer] and started having upper abd pain that goes downward. Had lap assist vag hyst on 12/13 at [facility]. Normal BM this am but hurts to have BM and urinate. Some vag d/c. Some nausea but no emesis or diarrhea

## 2017-11-27 NOTE — MAU Note (Signed)
Pt ambulated slowly to BR to obtain urine for u/a.

## 2017-11-27 NOTE — MAU Provider Note (Signed)
History     CSN: [id number]  Arrival date and time: 11/27/17 2310   None     Chief Complaint  Patient presents with  . Abdominal Pain   Pt presents to MAU by EMS with upper abdominal pain that started while she was eating at the [employer]. She also states that it hurts when she has a BM and burns and itches when she urinates She is post op 9 days from Robotic vaginal hysterectomy.     Past Medical History:  Diagnosis Date  . Anemia   . Asthma    as teenager, no problems as adult, no inhaler  . Eczema   . Headache    menstrual   . Seasonal allergies   . SVD (spontaneous vaginal delivery) 12/28/199   x 1 at 34 wks stillborn  . Umbilical hernia     Past Surgical History:  Procedure Laterality Date  . ABDOMINAL HYSTERECTOMY    . BREAST SURGERY     reduction  . COLONOSCOPY    . DILATION AND CURETTAGE OF UTERUS     w/myomectomy  . LASER ABLATION  2015   uterine  . LIPOSUCTION TRUNK     tummy  . MYOMECTOMY VAGINAL APPROACH     x 2  . ROBOTIC ASSISTED TOTAL HYSTERECTOMY WITH SALPINGECTOMY Bilateral 11/18/2017   Procedure: ROBOTIC ASSISTED TOTAL HYSTERECTOMY WITH SALPINGECTOMY;  Surgeon: Delsa Bern, MD;  Location: [facility];  Service: Gynecology;  Laterality: Bilateral;  . WISDOM TOOTH EXTRACTION      Family History  Problem Relation Age of Onset  . Allergic rhinitis Neg Hx   . Angioedema Neg Hx   . Asthma Neg Hx   . Eczema Neg Hx   . Immunodeficiency Neg Hx   . Urticaria Neg Hx     Social History   Tobacco Use  . Smoking status: Never Smoker  . Smokeless tobacco: Never Used  Substance Use Topics  . Alcohol use: No    Frequency: Never  . Drug use: No    Allergies:  Allergies  Allergen Reactions  . Penicillins     Chest and stomach pain Has patient had a PCN reaction causing immediate rash, facial/tongue/throat swelling, SOB or lightheadedness with hypotension: Unknown Has patient had a PCN reaction causing severe rash involving mucus  membranes or skin necrosis: Unknown Has patient had a PCN reaction that required hospitalization: No Has patient had a PCN reaction occurring within the last 10 years: Unknown If all of the above answers are "NO", then may proceed with Cephalosporin use.    . Shellfish Allergy Swelling and Rash    Lips swell     Medications Prior to Admission  Medication Sig Dispense Refill Last Dose  . ibuprofen (ADVIL,MOTRIN) 600 MG tablet Take 600 mg by mouth every 6 (six) hours as needed.   11/27/2017 at Unknown time  . TURMERIC PO Take 1 tablet by mouth 4 (four) times a week.   11/26/2017 at Unknown time  . VITAMIN A PO Take 1 tablet by mouth daily.   11/26/2017 at Unknown time  . VITAMIN E PO Take 1 tablet by mouth daily.   11/26/2017 at Unknown time  . VITAMIN K PO Take 1 tablet by mouth daily.   11/26/2017 at Unknown time  . acetaminophen (TYLENOL) 500 MG tablet Take 1,000 mg by mouth every 8 (eight) hours as needed for mild pain or headache.   Past Week at Unknown time  . azelastine (ASTELIN) 0.1 % nasal spray Place  2 sprays into both nostrils 2 (two) times daily. Use in each nostril as directed (Patient not taking: Reported on 11/11/2017) 30 mL 5 Not Taking at Unknown time  . BLACK CURRANT SEED OIL PO Take 1 capsule by mouth daily.   Past Week at Unknown time  . CHOLECALCIFEROL PO Take 2 tablets by mouth daily.   Past Week at Unknown time  . fluticasone (FLONASE) 50 MCG/ACT nasal spray Place 2 sprays into both nostrils daily. (Patient not taking: Reported on 11/11/2017) 16 g 5 Not Taking at Unknown time  . meclizine (ANTIVERT) 25 MG tablet 1 TABLET BY MOUTH EVERY 6 HOURS AS NEEDED VERTIGO  0 More than a month at Unknown time  . OVER THE COUNTER MEDICATION Take 1 tablet by mouth daily. Thyroid Multi Vitamin   Past Week at Unknown time    Review of Systems  Gastrointestinal: Positive for abdominal pain.       C/o pain at incision area while bearing down to have BM  Genitourinary: Positive for  dysuria. Negative for pelvic pain.       Itching and burning while urinating   Physical Exam   Blood pressure 117/66, pulse 83, temperature 98.1 F (36.7 C), resp. rate 18, last menstrual period 11/18/2017, SpO2 100 %.  Physical Exam  Nursing note and vitals reviewed. Constitutional: She is oriented to person, place, and time. She appears well-developed and well-nourished.  HENT:  Head: Normocephalic and atraumatic.  Cardiovascular: Normal rate.  Respiratory: Effort normal. No respiratory distress.  GI: Soft. There is tenderness.  Upper abdomen to mid abdomen on right ; solft to palpate  Musculoskeletal: Normal range of motion.  Neurological: She is alert and oriented to person, place, and time.  Skin: Skin is warm and dry.  Psychiatric: She has a normal mood and affect. Her behavior is normal. Thought content normal.   Results for orders placed or performed during the hospital encounter of 11/27/17 (from the past 24 hour(s))  CBC     Status: Abnormal   Collection Time: 11/27/17 11:31 PM  Result Value Ref Range   WBC 7.8 4.0 - 10.5 K/uL   RBC 3.96 3.87 - 5.11 MIL/uL   Hemoglobin 11.8 (L) 12.0 - 15.0 g/dL   HCT 35.1 (L) 36.0 - 46.0 %   MCV 88.6 78.0 - 100.0 fL   MCH 29.8 26.0 - 34.0 pg   MCHC 33.6 30.0 - 36.0 g/dL   RDW 13.2 11.5 - 15.5 %   Platelets 370 150 - 400 K/uL  Wet prep, genital     Status: Abnormal   Collection Time: 11/28/17 12:25 AM  Result Value Ref Range   Yeast Wet Prep HPF POC NONE SEEN NONE SEEN   Trich, Wet Prep NONE SEEN NONE SEEN   Clue Cells Wet Prep HPF POC NONE SEEN NONE SEEN   WBC, Wet Prep HPF POC FEW (A) NONE SEEN   Sperm NONE SEEN    Ua- Negative Ct Abdomen Pelvis W Contrast  Result Date: 11/28/2017 CLINICAL DATA:  Acute onset of mid upper abdominal pain and lower abdominal pain. Recent hysterectomy. EXAM: CT ABDOMEN AND PELVIS WITH CONTRAST TECHNIQUE: Multidetector CT imaging of the abdomen and pelvis was performed using the standard protocol  following bolus administration of intravenous contrast. CONTRAST:  100 mL of Isovue 300 IV contrast COMPARISON:  None. FINDINGS: Lower chest: The visualized lung bases are grossly clear. The visualized portions of the mediastinum are unremarkable. Hepatobiliary: The liver is unremarkable in appearance. The gallbladder  is unremarkable in appearance. The common bile duct remains normal in caliber. Pancreas: The pancreas is within normal limits. Spleen: The spleen is unremarkable in appearance. Adrenals/Urinary Tract: The adrenal glands are unremarkable in appearance. The kidneys are within normal limits. There is no evidence of hydronephrosis. No renal or ureteral stones are identified. No perinephric stranding is seen. Stomach/Bowel: The appendix appears mildly prominent, measuring 9 mm in diameter, without definite evidence of appendicitis. The colon is grossly unremarkable in appearance. There is distention of small-bowel loops to 3.5 cm in maximal diameter, with partial fecalization at the distal ileum. Wall thickening is noted at the distal ileum, with surrounding soft tissue inflammation, concerning for an acute infectious or inflammatory process. The stomach is grossly unremarkable in appearance. A small amount of free fluid is noted along the right paracolic gutter. Vascular/Lymphatic: The abdominal aorta is unremarkable in appearance. The inferior vena cava is grossly unremarkable. No retroperitoneal lymphadenopathy is seen. No pelvic sidewall lymphadenopathy is identified. Reproductive: The patient is status post hysterectomy. Wall thickening and soft tissue inflammation is noted at the vaginal cuff, concerning for underlying infectious process. No suspicious adnexal masses are seen. Other: Scattered soft tissue air is noted along the anterior abdominal wall. Postoperative change is noted superior to the umbilicus. Musculoskeletal: No acute osseous abnormalities are identified. The visualized musculature is  unremarkable in appearance. IMPRESSION: 1. Distention of small-bowel loops to 3.5 cm in maximal diameter, with partial fecalization of the distal ileum. Wall thickening at the distal ileum, with surrounding soft tissue inflammation, concerning for an acute infectious or inflammatory process. 2. Small amount of free fluid noted along the right paracolic gutter. 3. Wall thickening and soft tissue inflammation at the vaginal cuff, concerning for underlying infectious process at the pelvis. 4. Scattered soft tissue air along the anterior abdominal wall, likely postoperative in nature. Electronically Signed   By: Garald Balding M.D.   On: 11/28/2017 05:11    MAU Course  Procedures  MDM Urine Neg; Labs Negative; Pt continues to have intermittent discomfort. Discussed all results with Dr. Landry Mellow and the CT scan. Dr Landry Mellow is assuming care of the patient and will be in to assess her.  Assessment and Plan  Post operative Complications s/p Hysterectomy with Bilateral Salping  Percocet/ ibuprofen for discomfort  Dr Landry Mellow to evaluate.  Yvonne Kendall CNM 11/28/17 644am     Lori A Clemmons 11/27/2017, 11:56 PM    Patient seen and examined. Pt reports that pain is epigastric and started yesterday. Pain was originally 8 out of 10 on presentation however has improved to "virtually no pain" per patient. However still rates as 3 out of 10. Denies nausea or vomting. Normal bowel and bladder function.  On exam Abdomen is soft slightly distended appropriately tender +BS in all 4 quadrants. Incisions are well approximated.  Assessment- Postoperative epigastric abdominal pain that could be related to inflammation.  Recommend continuing Ibuprofen 600 mg every 6 hours with food for the next 48 hours. Percocet for breakthrough pain.  Pt is comfortable with this plan and is stable for discharge home.  She should follow up at regular postoperative visit.

## 2017-11-28 ENCOUNTER — Inpatient Hospital Stay (HOSPITAL_COMMUNITY): Payer: BC Managed Care – PPO

## 2017-11-28 ENCOUNTER — Encounter (HOSPITAL_COMMUNITY): Payer: Self-pay

## 2017-11-28 LAB — URINALYSIS, ROUTINE W REFLEX MICROSCOPIC
BILIRUBIN URINE: NEGATIVE
GLUCOSE, UA: NEGATIVE mg/dL
HGB URINE DIPSTICK: NEGATIVE
KETONES UR: NEGATIVE mg/dL
Leukocytes, UA: NEGATIVE
Nitrite: NEGATIVE
PROTEIN: NEGATIVE mg/dL
Specific Gravity, Urine: 1.014 (ref 1.005–1.030)
pH: 7 (ref 5.0–8.0)

## 2017-11-28 LAB — WET PREP, GENITAL
Clue Cells Wet Prep HPF POC: NONE SEEN
Sperm: NONE SEEN
Trich, Wet Prep: NONE SEEN
Yeast Wet Prep HPF POC: NONE SEEN

## 2017-11-28 MED ORDER — OXYCODONE-ACETAMINOPHEN 5-325 MG PO TABS
1.0000 | ORAL_TABLET | Freq: Once | ORAL | Status: AC
  Administered 2017-11-28: 1 via ORAL
  Filled 2017-11-28: qty 1

## 2017-11-28 MED ORDER — IOPAMIDOL (ISOVUE-300) INJECTION 61%
100.0000 mL | Freq: Once | INTRAVENOUS | Status: AC | PRN
  Administered 2017-11-28: 100 mL via INTRAVENOUS

## 2017-11-28 MED ORDER — SODIUM CHLORIDE 0.9 % IV SOLN
Freq: Once | INTRAVENOUS | Status: AC
  Administered 2017-11-28: 03:00:00 via INTRAVENOUS

## 2017-11-28 MED ORDER — IOPAMIDOL (ISOVUE-300) INJECTION 61%
30.0000 mL | Freq: Once | INTRAVENOUS | Status: AC | PRN
  Administered 2017-11-28: 30 mL via ORAL

## 2017-11-28 MED ORDER — IBUPROFEN 600 MG PO TABS
600.0000 mg | ORAL_TABLET | Freq: Once | ORAL | Status: AC
  Administered 2017-11-28: 600 mg via ORAL
  Filled 2017-11-28: qty 1

## 2017-11-28 NOTE — MAU Note (Signed)
Pt drinking Isovue 300 oral contrast and tol well. Understands to drink 2cups now and 1 in 64mins. WIll have CT 1.5hrs after starting oral contrast per radiology

## 2017-11-28 NOTE — Progress Notes (Signed)
Call made to [facility] CNM for update as to POC for pt. Message left for CNM to return call to MAU with update

## 2017-11-28 NOTE — MAU Note (Signed)
Pt states pain she is having feels like her diverticulitis flare.  SHe was taking two antibiotics before hysterectomy for diverticulitis but was told to stop when had surgery

## 2017-11-28 NOTE — Progress Notes (Signed)
Lori clemmons CNM notified of pt's status with waves of abd pain and still unable to urinate. May give po flds and ibuprofen

## 2017-11-28 NOTE — MAU Note (Signed)
Lori Clemmons CNM notified u/s states CT of abd and pelvis is best to ck for diverticulitis.

## 2017-11-28 NOTE — Progress Notes (Signed)
Lori Clemmons CNM notified of pt's pain and how feels like when she had diverticulitis. RN to call u/s and see what would be best test to check for diverticulitis. U/S paged

## 2017-11-28 NOTE — Progress Notes (Signed)
Blind vag swab for wet prep.

## 2017-11-28 NOTE — Progress Notes (Signed)
Lori Clemmons CNM notifed of CT results. Will review results and speak with Dr Landry Mellow regarding findings

## 2017-12-06 ENCOUNTER — Inpatient Hospital Stay (HOSPITAL_COMMUNITY)
Admission: AD | Admit: 2017-12-06 | Discharge: 2017-12-06 | Disposition: A | Discharge status: Home / Self Care | Payer: BC Managed Care – PPO | Source: Ambulatory Visit | Attending: Obstetrics & Gynecology | Admitting: Obstetrics & Gynecology

## 2017-12-06 ENCOUNTER — Encounter (HOSPITAL_COMMUNITY): Payer: Self-pay | Admitting: *Deleted

## 2017-12-06 DIAGNOSIS — Z79899 Other long term (current) drug therapy: Secondary | ICD-10-CM | POA: Diagnosis not present

## 2017-12-06 DIAGNOSIS — Z90722 Acquired absence of ovaries, bilateral: Secondary | ICD-10-CM | POA: Diagnosis not present

## 2017-12-06 DIAGNOSIS — Z90710 Acquired absence of both cervix and uterus: Secondary | ICD-10-CM | POA: Diagnosis not present

## 2017-12-06 DIAGNOSIS — G8918 Other acute postprocedural pain: Secondary | ICD-10-CM

## 2017-12-06 DIAGNOSIS — Z9079 Acquired absence of other genital organ(s): Secondary | ICD-10-CM | POA: Insufficient documentation

## 2017-12-06 DIAGNOSIS — N99820 Postprocedural hemorrhage and hematoma of a genitourinary system organ or structure following a genitourinary system procedure: Secondary | ICD-10-CM | POA: Diagnosis not present

## 2017-12-06 DIAGNOSIS — J45909 Unspecified asthma, uncomplicated: Secondary | ICD-10-CM | POA: Diagnosis not present

## 2017-12-06 LAB — CBC WITH DIFFERENTIAL/PLATELET
BASOS ABS: 0 10*3/uL (ref 0.0–0.1)
BASOS PCT: 0 %
EOS ABS: 0 10*3/uL (ref 0.0–0.7)
EOS PCT: 1 %
HCT: 35.4 % — ABNORMAL LOW (ref 36.0–46.0)
Hemoglobin: 11.6 g/dL — ABNORMAL LOW (ref 12.0–15.0)
LYMPHS PCT: 29 %
Lymphs Abs: 1.4 10*3/uL (ref 0.7–4.0)
MCH: 29.4 pg (ref 26.0–34.0)
MCHC: 32.8 g/dL (ref 30.0–36.0)
MCV: 89.6 fL (ref 78.0–100.0)
MONO ABS: 0.5 10*3/uL (ref 0.1–1.0)
Monocytes Relative: 11 %
Neutro Abs: 2.8 10*3/uL (ref 1.7–7.7)
Neutrophils Relative %: 59 %
PLATELETS: 333 10*3/uL (ref 150–400)
RBC: 3.95 MIL/uL (ref 3.87–5.11)
RDW: 13.1 % (ref 11.5–15.5)
WBC: 4.8 10*3/uL (ref 4.0–10.5)

## 2017-12-06 MED ORDER — IBUPROFEN 800 MG PO TABS: 800.0000 mg | ORAL_TABLET | Freq: Three times a day (TID) | ORAL | 0 refills | Status: DC | PRN

## 2017-12-06 MED ORDER — OXYCODONE-ACETAMINOPHEN 5-325 MG PO TABS: 1.0000 | ORAL_TABLET | Freq: Once | ORAL | Status: DC

## 2017-12-06 NOTE — MAU Note (Signed)
Pt presents with c/o abdominal cramping, soreness and VB began this morning.  Pt reports bleeding has "slowed down", but initially was like a "heavy period".  Denies clots.

## 2017-12-06 NOTE — MAU Provider Note (Signed)
History   41 y/o POD # 18 after a robotic assisted hysteroctomy and bilateral salpingectomy for fibroid uterus, presenting complaining of abdominal cramping and vaginal bleeding.   Abdominal Pain: Patient complains of abdominal pain. The pain is described as cramping, and is 5/10 in intensity. Pain is located in the RLQ, suprapubic without radiation. Onset was several hours ago. Symptoms have been gradually improving since. Aggravating factors: none.  Alleviating factors: NSAIDs. Associated symptoms: none. The patient denies constipation, diarrhea, dysuria, fever, nausea, sweats and vomiting.  Vaginal bleed felt like a "period gush". She has been more active lately, ambulating more, climbing stairs.  Happened suddenly then ceased.     Patient Active Problem List   Diagnosis Date Noted  . Fibroid, uterine 11/18/2017    Chief Complaint  Patient presents with  . Vaginal Bleeding  . Abdominal Pain   HPI  OB History    Gravida Para Term Preterm AB Living   1 1   1        SAB TAB Ectopic Multiple Live Births           0      Past Medical History:  Diagnosis Date  . Anemia   . Asthma    as teenager, no problems as adult, no inhaler  . Eczema   . Headache    menstrual   . Seasonal allergies   . SVD (spontaneous vaginal delivery) 12/28/199   x 1 at 34 wks stillborn  . Umbilical hernia     Past Surgical History:  Procedure Laterality Date  . ABDOMINAL HYSTERECTOMY    . BREAST SURGERY     reduction  . COLONOSCOPY    . DILATION AND CURETTAGE OF UTERUS     w/myomectomy  . LASER ABLATION  2015   uterine  . LIPOSUCTION TRUNK     tummy  . MYOMECTOMY VAGINAL APPROACH     x 2  . ROBOTIC ASSISTED TOTAL HYSTERECTOMY WITH SALPINGECTOMY Bilateral 11/18/2017   Procedure: ROBOTIC ASSISTED TOTAL HYSTERECTOMY WITH SALPINGECTOMY;  Surgeon: Delsa Bern, MD;  Location: [facility];  Service: Gynecology;  Laterality: Bilateral;  . WISDOM TOOTH EXTRACTION      Family History  Problem  Relation Age of Onset  . Thyroid disease Mother   . Allergic rhinitis Neg Hx   . Angioedema Neg Hx   . Asthma Neg Hx   . Eczema Neg Hx   . Immunodeficiency Neg Hx   . Urticaria Neg Hx     Social History   Tobacco Use  . Smoking status: Never Smoker  . Smokeless tobacco: Never Used  Substance Use Topics  . Alcohol use: No    Frequency: Never  . Drug use: No    Allergies:  Allergies  Allergen Reactions  . Penicillins     Chest and stomach pain Has patient had a PCN reaction causing immediate rash, facial/tongue/throat swelling, SOB or lightheadedness with hypotension: Unknown Has patient had a PCN reaction causing severe rash involving mucus membranes or skin necrosis: Unknown Has patient had a PCN reaction that required hospitalization: No Has patient had a PCN reaction occurring within the last 10 years: Unknown If all of the above answers are "NO", then may proceed with Cephalosporin use.    . Shellfish Allergy Swelling and Rash    Lips swell     Medications Prior to Admission  Medication Sig Dispense Refill Last Dose  . ibuprofen (ADVIL,MOTRIN) 600 MG tablet Take 600 mg by mouth every 6 (six) hours as  needed.   12/06/2017 at Unknown time  . levofloxacin (LEVAQUIN) 500 MG tablet Take 500 mg by mouth daily.   12/05/2017 at Unknown time  . oxyCODONE-acetaminophen (PERCOCET) 10-325 MG tablet Take 1 tablet by mouth every 4 (four) hours as needed for pain.   12/05/2017 at Unknown time  . TURMERIC PO Take 1 tablet by mouth 4 (four) times a week.   Past Week at Unknown time  . VITAMIN A PO Take 1 tablet by mouth daily.   Past Week at Unknown time  . VITAMIN E PO Take 1 tablet by mouth daily.   Past Week at Unknown time  . VITAMIN K PO Take 1 tablet by mouth daily.   Past Week at Unknown time  . azelastine (ASTELIN) 0.1 % nasal spray Place 2 sprays into both nostrils 2 (two) times daily. Use in each nostril as directed (Patient not taking: Reported on 11/11/2017) 30 mL 5 Not  Taking at Unknown time  . fluticasone (FLONASE) 50 MCG/ACT nasal spray Place 2 sprays into both nostrils daily. (Patient not taking: Reported on 11/11/2017) 16 g 5 Not Taking at Unknown time  . meclizine (ANTIVERT) 25 MG tablet 1 TABLET BY MOUTH EVERY 6 HOURS AS NEEDED VERTIGO  0 More than a month at Unknown time    ROS  Constitutional: Denies fevers/chills Cardiovascular: Denies chest pain or palpitations Pulmonary: Denies coughing or wheezing Gastrointestinal: Denies nausea, vomiting or diarrhea Genitourinary: Denies unusual vaginal discharge, dysuria, urgency or frequency. With unusual vaginal bleeding and pelvic pain.   Musculoskeletal: Denies muscle or joint aches and pain.  Neurology: Denies abnormal sensations such as tingling or numbness.   Physical Exam   Blood pressure 111/76, pulse 95, temperature 97.7 F (36.5 C), temperature source Oral, resp. rate 18, last menstrual period 11/18/2017. Gen: No acute distress Abdomen: Soft, tender to palpation right lower quadrant, no rebound, no guarding.  Extremities: Warm and well perfused Speculum: Small dark red- pink blood in vaginal vault.  Bimanual exam: Soft, no parametrial masses, no parametrial tenderness. Intact sutures palpable on exam.   LABS:  CBC    Component Value Date/Time   WBC 4.8 12/06/2017 1005   RBC 3.95 12/06/2017 1005   HGB 11.6 (L) 12/06/2017 1005   HCT 35.4 (L) 12/06/2017 1005   PLT 333 12/06/2017 1005   MCV 89.6 12/06/2017 1005   MCH 29.4 12/06/2017 1005   MCHC 32.8 12/06/2017 1005   RDW 13.1 12/06/2017 1005   LYMPHSABS 1.4 12/06/2017 1005   MONOABS 0.5 12/06/2017 1005   EOSABS 0.0 12/06/2017 1005   BASOSABS 0.0 12/06/2017 1005   CBC Latest Ref Rng & Units 12/06/2017 11/27/2017 11/16/2017  WBC 4.0 - 10.5 K/uL 4.8 7.8 3.9(L)  Hemoglobin 12.0 - 15.0 g/dL 11.6(L) 11.8(L) 12.4  Hematocrit 36.0 - 46.0 % 35.4(L) 35.1(L) 38.5  Platelets 150 - 400 K/uL 333 370 305   Urinalysis    Component Value  Date/Time   COLORURINE YELLOW 11/28/2017 0120   APPEARANCEUR CLOUDY (A) 11/28/2017 0120   LABSPEC 1.014 11/28/2017 0120   PHURINE 7.0 11/28/2017 0120   GLUCOSEU NEGATIVE 11/28/2017 0120   HGBUR NEGATIVE 11/28/2017 0120   BILIRUBINUR NEGATIVE 11/28/2017 0120   KETONESUR NEGATIVE 11/28/2017 0120   PROTEINUR NEGATIVE 11/28/2017 0120   NITRITE NEGATIVE 11/28/2017 0120   LEUKOCYTESUR NEGATIVE 11/28/2017 0120     ED Course Patient was offered narcotic pain medication but she declined and stated her pain was low intensity.  Labs were drawn as above.  Assessment: 41 y/o with post-op pain and post op bleeding   Plan:  -Patient declines to take narcotic pain medication citing sedating side effects.  Ibuprofen 600 mg use three times a day moderately controls her pain,  she desires ibuprofen 800 mg TID.  -Post op bleeding is small amount, no active bleeding.  Likely is normal post-op changes.  C/w close monitoring.   - Pain and bleeding precautions reviewed.    -Office follow up as scheduled this week.    Alinda Dooms MD.  12/06/2017 12:50 PM

## 2017-12-06 NOTE — MAU Note (Signed)
In lab

## 2017-12-06 NOTE — MAU Note (Signed)
Pt had robotic assisted Hysterectomy on 11/18/17. Stated she started having increased abdominal pain yesterday. When she went to the BR today she noticed some vaginal bleeding like a period. Tok some oxycodone last night with some relief.

## 2018-06-30 ENCOUNTER — Ambulatory Visit: Payer: BC Managed Care – PPO | Admitting: Allergy

## 2018-11-03 IMAGING — CT CT ABD-PELV W/ CM
1 of 2 series · 14 of 32 positions shown, 18 images · IV contrast (OMNIPAQUE)
Comparison: None.

CLINICAL DATA: Acute onset of mid upper abdominal pain and lower
abdominal pain. Recent hysterectomy.

EXAM:
CT ABDOMEN AND PELVIS WITH CONTRAST
TECHNIQUE: Multidetector CT imaging of the abdomen and pelvis was performed
using the standard protocol following bolus administration of
intravenous contrast.
CONTRAST:  100 mL of Isovue 300 IV contrast

[Series 2: routine abdomen/pelvis with · axial · 0.60mm/px · z∈[+574,+914]mm · 14 of 79 slices shown, 18 images]
[im 7/79  soft-tissue]
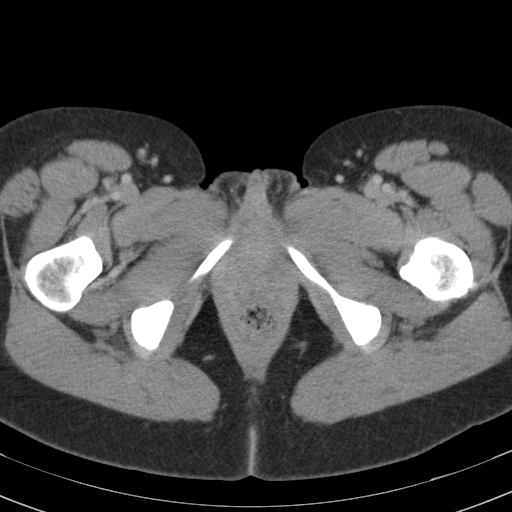
[im 7/79  bone]
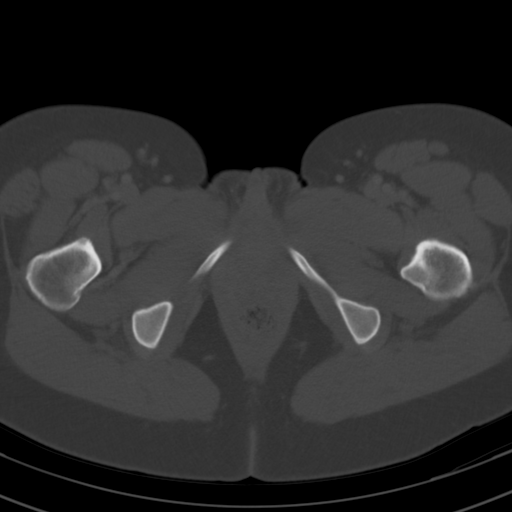
[im 13/79  soft-tissue]
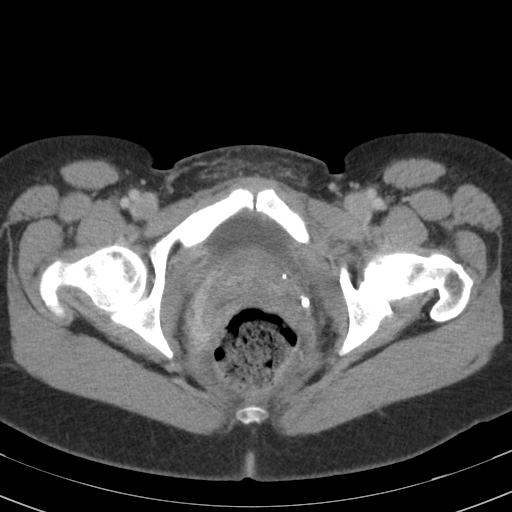
[im 19/79  soft-tissue]
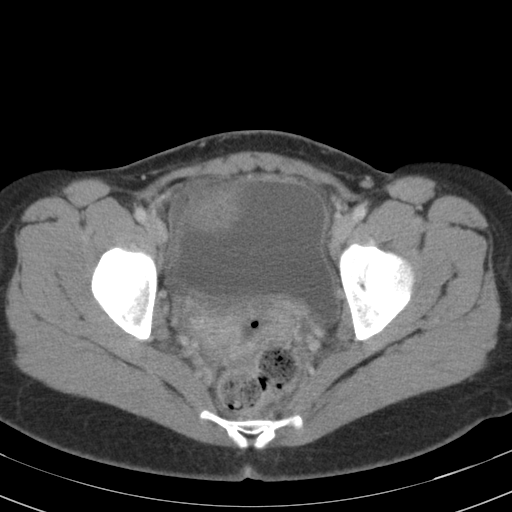
[im 25/79  soft-tissue]
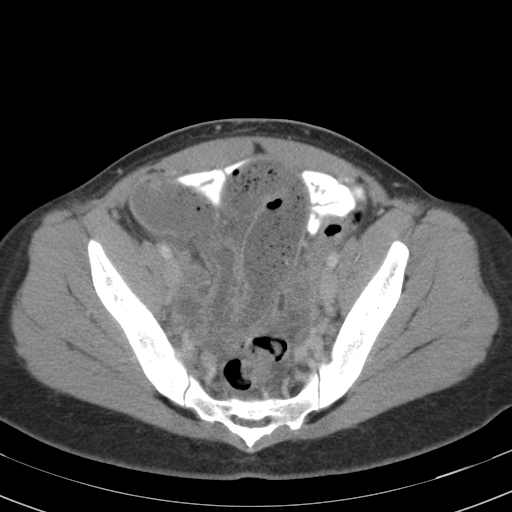
[im 32/79  soft-tissue]
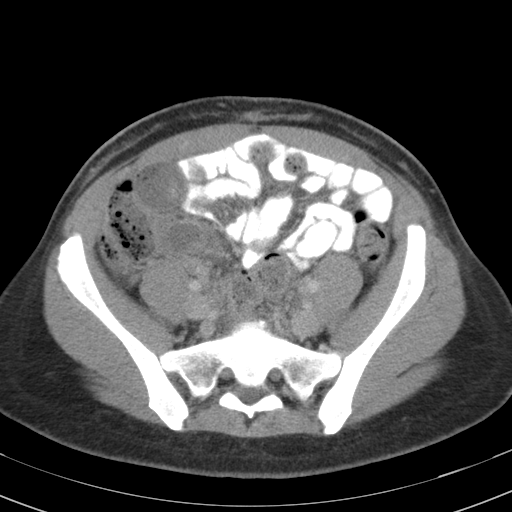
[im 38/79  soft-tissue]
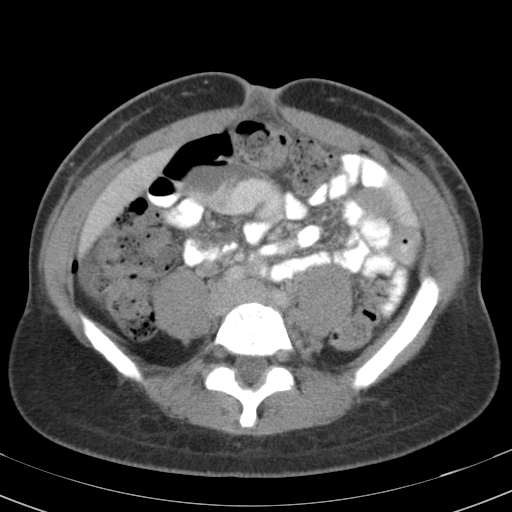
[im 44/79  soft-tissue]
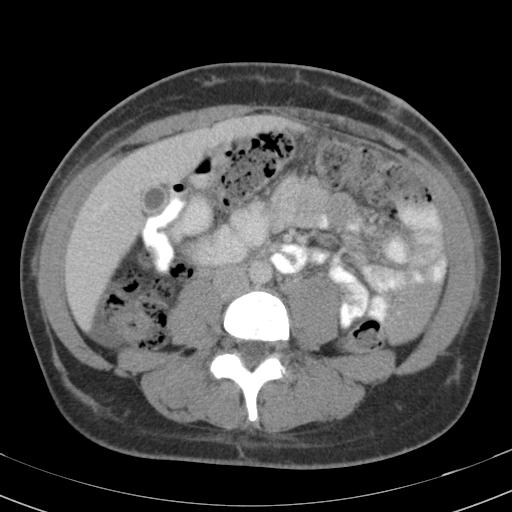
[im 50/79  soft-tissue]
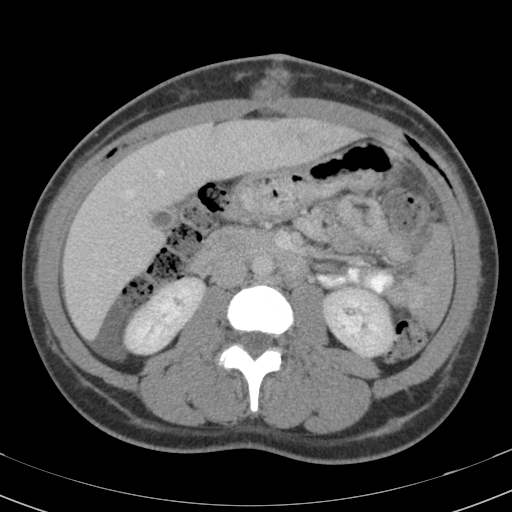
[im 57/79  soft-tissue]
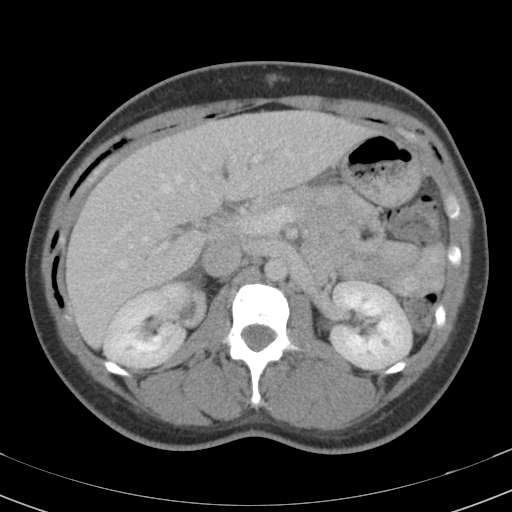
[im 57/79  bone]
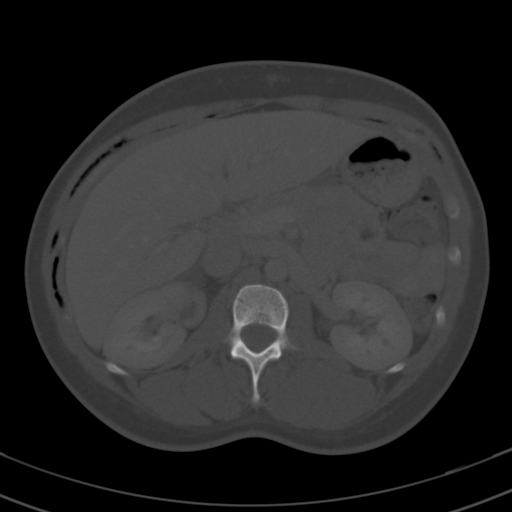
[im 63/79  soft-tissue]
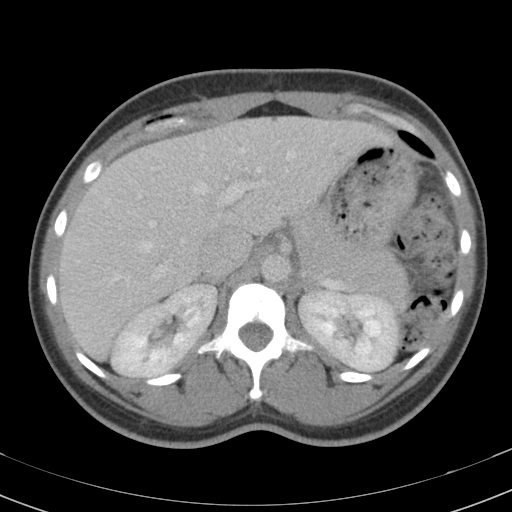
[im 66/79  lung]
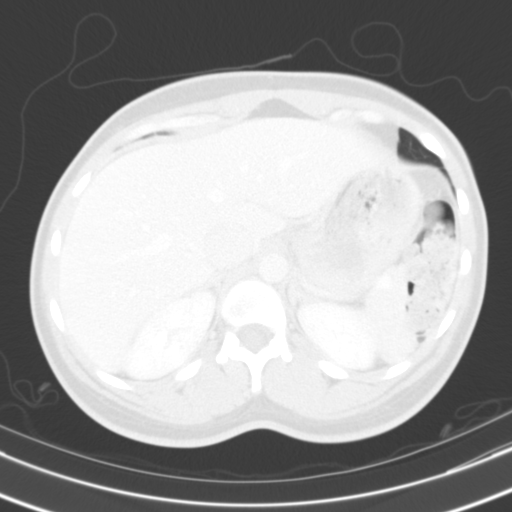
[im 69/79  soft-tissue]
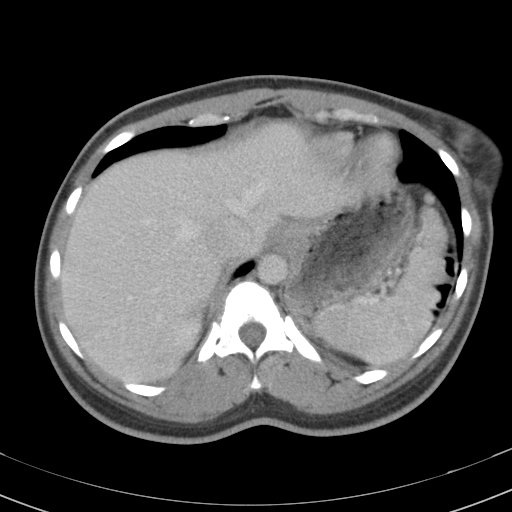
[im 69/79  lung]
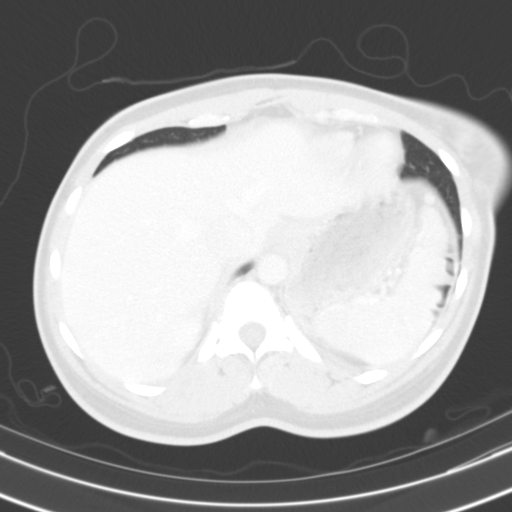
[im 72/79  lung]
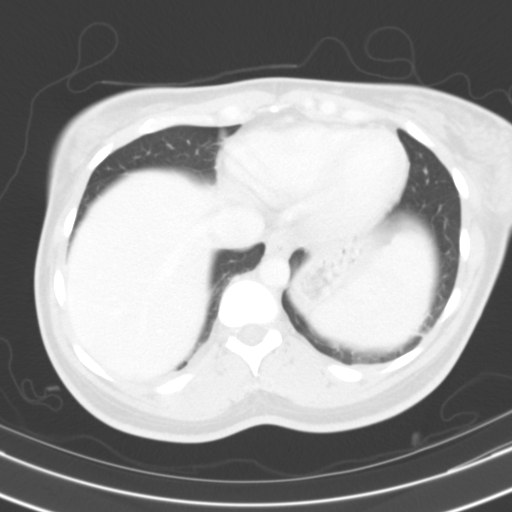
[im 75/79  soft-tissue]
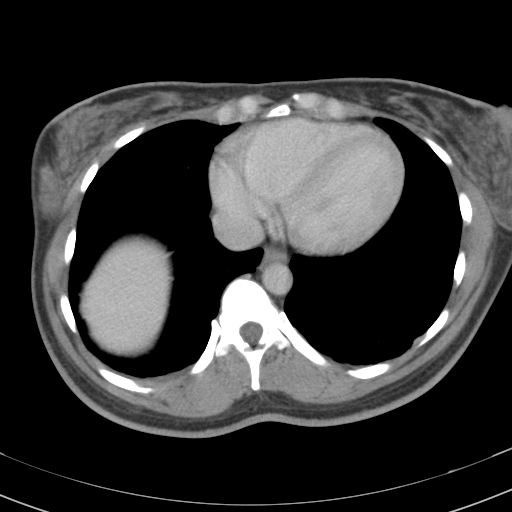
[im 75/79  lung]
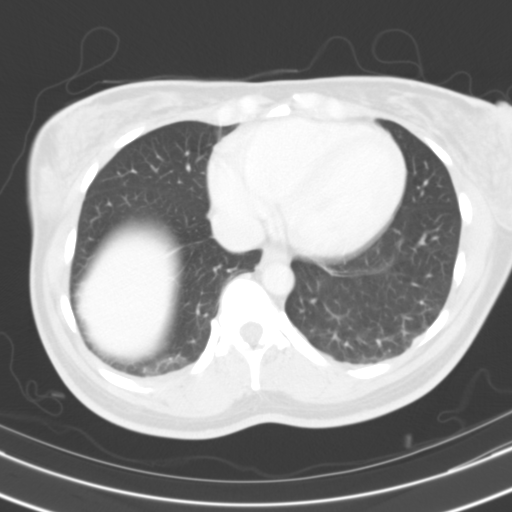

[14 of 32 positions shown; findings below may reference images not displayed]

FINDINGS: Lower chest: The visualized lung bases are grossly clear. The
visualized portions of the mediastinum are unremarkable.

Hepatobiliary: The liver is unremarkable in appearance. The
gallbladder is unremarkable in appearance. The common bile duct
remains normal in caliber.

Pancreas: The pancreas is within normal limits.

Spleen: The spleen is unremarkable in appearance.

Adrenals/Urinary Tract: The adrenal glands are unremarkable in
appearance. The kidneys are within normal limits. There is no
evidence of hydronephrosis. No renal or ureteral stones are
identified. No perinephric stranding is seen.

Stomach/Bowel: The appendix appears mildly prominent, measuring 9 mm
in diameter, without definite evidence of appendicitis. The colon is
grossly unremarkable in appearance.

There is distention of small-bowel loops to 3.5 cm in maximal
diameter, with partial fecalization at the distal ileum. Wall
thickening is noted at the distal ileum, with surrounding soft
tissue inflammation, concerning for an acute infectious or
inflammatory process.

The stomach is grossly unremarkable in appearance. A small amount of
free fluid is noted along the right paracolic gutter.

Vascular/Lymphatic: The abdominal aorta is unremarkable in
appearance. The inferior vena cava is grossly unremarkable. No
retroperitoneal lymphadenopathy is seen. No pelvic sidewall
lymphadenopathy is identified.

Reproductive: The patient is status post hysterectomy. Wall
thickening and soft tissue inflammation is noted at the vaginal
cuff, concerning for underlying infectious process. No suspicious
adnexal masses are seen.

Other: Scattered soft tissue air is noted along the anterior
abdominal wall. Postoperative change is noted superior to the
umbilicus.

Musculoskeletal: No acute osseous abnormalities are identified. The
visualized musculature is unremarkable in appearance.
IMPRESSION: 1. Distention of small-bowel loops to 3.5 cm in maximal diameter,
with partial fecalization of the distal ileum. Wall thickening at
the distal ileum, with surrounding soft tissue inflammation,
concerning for an acute infectious or inflammatory process.
2. Small amount of free fluid noted along the right paracolic
gutter.
3. Wall thickening and soft tissue inflammation at the vaginal cuff,
concerning for underlying infectious process at the pelvis.
4. Scattered soft tissue air along the anterior abdominal wall,
likely postoperative in nature.

## 2019-02-24 ENCOUNTER — Other Ambulatory Visit: Payer: Self-pay

## 2019-02-24 ENCOUNTER — Emergency Department (HOSPITAL_COMMUNITY)
Admission: EM | Admit: 2019-02-24 | Discharge: 2019-02-24 | Disposition: A | Discharge status: Home / Self Care | Payer: BC Managed Care – PPO | Attending: Emergency Medicine | Admitting: Emergency Medicine

## 2019-02-24 ENCOUNTER — Encounter (HOSPITAL_COMMUNITY): Payer: Self-pay | Admitting: *Deleted

## 2019-02-24 DIAGNOSIS — R51 Headache: Secondary | ICD-10-CM | POA: Diagnosis not present

## 2019-02-24 DIAGNOSIS — Z5321 Procedure and treatment not carried out due to patient leaving prior to being seen by health care provider: Secondary | ICD-10-CM | POA: Diagnosis not present

## 2019-02-24 NOTE — ED Triage Notes (Signed)
Per [facility], pt has hx of migraines.  Migraine started @ 1700, took Advil migraine x 2, c/o vertigo & light sensitivity.

## 2019-02-24 NOTE — ED Notes (Signed)
Patient handed her stickers to registration, stated that she wasn't going to stay, and would like for [place] to take her out.

## 2019-11-28 ENCOUNTER — Other Ambulatory Visit: Payer: BC Managed Care – PPO

## 2020-03-14 ENCOUNTER — Other Ambulatory Visit: Payer: Self-pay | Admitting: Obstetrics and Gynecology

## 2020-03-14 DIAGNOSIS — Z01419 Encounter for gynecological examination (general) (routine) without abnormal findings: Secondary | ICD-10-CM | POA: Diagnosis not present

## 2020-03-14 DIAGNOSIS — R140 Abdominal distension (gaseous): Secondary | ICD-10-CM | POA: Diagnosis not present

## 2020-03-14 DIAGNOSIS — Z6825 Body mass index (BMI) 25.0-25.9, adult: Secondary | ICD-10-CM | POA: Diagnosis not present

## 2020-03-14 DIAGNOSIS — R102 Pelvic and perineal pain: Secondary | ICD-10-CM

## 2020-03-14 DIAGNOSIS — Z1231 Encounter for screening mammogram for malignant neoplasm of breast: Secondary | ICD-10-CM | POA: Diagnosis not present

## 2020-03-19 DIAGNOSIS — R140 Abdominal distension (gaseous): Secondary | ICD-10-CM | POA: Diagnosis not present

## 2020-03-19 DIAGNOSIS — K5900 Constipation, unspecified: Secondary | ICD-10-CM | POA: Diagnosis not present

## 2020-03-19 DIAGNOSIS — R1013 Epigastric pain: Secondary | ICD-10-CM | POA: Diagnosis not present

## 2020-03-19 DIAGNOSIS — R194 Change in bowel habit: Secondary | ICD-10-CM | POA: Diagnosis not present

## 2020-03-21 ENCOUNTER — Other Ambulatory Visit: Payer: Medicaid Other

## 2020-03-22 ENCOUNTER — Ambulatory Visit
Admission: RE | Admit: 2020-03-22 | Discharge: 2020-03-22 | Disposition: A | Discharge status: Home / Self Care | Payer: Medicaid Other | Source: Ambulatory Visit | Attending: Obstetrics and Gynecology | Admitting: Obstetrics and Gynecology

## 2020-03-22 DIAGNOSIS — R102 Pelvic and perineal pain: Secondary | ICD-10-CM | POA: Diagnosis not present

## 2020-04-02 DIAGNOSIS — R42 Dizziness and giddiness: Secondary | ICD-10-CM | POA: Diagnosis not present

## 2020-04-02 DIAGNOSIS — J309 Allergic rhinitis, unspecified: Secondary | ICD-10-CM | POA: Diagnosis not present

## 2020-04-09 DIAGNOSIS — F419 Anxiety disorder, unspecified: Secondary | ICD-10-CM | POA: Diagnosis not present

## 2020-04-16 DIAGNOSIS — K219 Gastro-esophageal reflux disease without esophagitis: Secondary | ICD-10-CM | POA: Diagnosis not present

## 2020-04-16 DIAGNOSIS — K5900 Constipation, unspecified: Secondary | ICD-10-CM | POA: Diagnosis not present

## 2020-04-16 DIAGNOSIS — F419 Anxiety disorder, unspecified: Secondary | ICD-10-CM | POA: Diagnosis not present

## 2020-04-23 DIAGNOSIS — Z0000 Encounter for general adult medical examination without abnormal findings: Secondary | ICD-10-CM | POA: Diagnosis not present

## 2020-04-23 DIAGNOSIS — H6121 Impacted cerumen, right ear: Secondary | ICD-10-CM | POA: Diagnosis not present

## 2020-04-23 DIAGNOSIS — Z13220 Encounter for screening for lipoid disorders: Secondary | ICD-10-CM | POA: Diagnosis not present

## 2020-04-23 DIAGNOSIS — F419 Anxiety disorder, unspecified: Secondary | ICD-10-CM | POA: Diagnosis not present

## 2020-04-30 DIAGNOSIS — F419 Anxiety disorder, unspecified: Secondary | ICD-10-CM | POA: Diagnosis not present

## 2020-05-03 DIAGNOSIS — M542 Cervicalgia: Secondary | ICD-10-CM | POA: Diagnosis not present

## 2020-05-03 DIAGNOSIS — M791 Myalgia, unspecified site: Secondary | ICD-10-CM | POA: Diagnosis not present

## 2020-05-03 DIAGNOSIS — M25561 Pain in right knee: Secondary | ICD-10-CM | POA: Diagnosis not present

## 2020-05-07 DIAGNOSIS — M542 Cervicalgia: Secondary | ICD-10-CM | POA: Diagnosis not present

## 2020-05-08 DIAGNOSIS — F411 Generalized anxiety disorder: Secondary | ICD-10-CM | POA: Diagnosis not present

## 2020-05-08 DIAGNOSIS — M25561 Pain in right knee: Secondary | ICD-10-CM | POA: Diagnosis not present

## 2020-05-08 DIAGNOSIS — M542 Cervicalgia: Secondary | ICD-10-CM | POA: Diagnosis not present

## 2020-05-08 DIAGNOSIS — M791 Myalgia, unspecified site: Secondary | ICD-10-CM | POA: Diagnosis not present

## 2020-05-13 DIAGNOSIS — M25561 Pain in right knee: Secondary | ICD-10-CM | POA: Diagnosis not present

## 2020-05-13 DIAGNOSIS — M542 Cervicalgia: Secondary | ICD-10-CM | POA: Diagnosis not present

## 2020-05-13 DIAGNOSIS — M791 Myalgia, unspecified site: Secondary | ICD-10-CM | POA: Diagnosis not present

## 2020-05-17 DIAGNOSIS — M542 Cervicalgia: Secondary | ICD-10-CM | POA: Diagnosis not present

## 2020-05-17 DIAGNOSIS — M791 Myalgia, unspecified site: Secondary | ICD-10-CM | POA: Diagnosis not present

## 2020-05-17 DIAGNOSIS — M25561 Pain in right knee: Secondary | ICD-10-CM | POA: Diagnosis not present

## 2020-05-20 DIAGNOSIS — M542 Cervicalgia: Secondary | ICD-10-CM | POA: Diagnosis not present

## 2020-05-20 DIAGNOSIS — M25561 Pain in right knee: Secondary | ICD-10-CM | POA: Diagnosis not present

## 2020-05-20 DIAGNOSIS — F4310 Post-traumatic stress disorder, unspecified: Secondary | ICD-10-CM | POA: Diagnosis not present

## 2020-05-20 DIAGNOSIS — M791 Myalgia, unspecified site: Secondary | ICD-10-CM | POA: Diagnosis not present

## 2020-05-22 DIAGNOSIS — M791 Myalgia, unspecified site: Secondary | ICD-10-CM | POA: Diagnosis not present

## 2020-05-22 DIAGNOSIS — M25561 Pain in right knee: Secondary | ICD-10-CM | POA: Diagnosis not present

## 2020-05-22 DIAGNOSIS — F411 Generalized anxiety disorder: Secondary | ICD-10-CM | POA: Diagnosis not present

## 2020-05-22 DIAGNOSIS — M542 Cervicalgia: Secondary | ICD-10-CM | POA: Diagnosis not present

## 2020-05-27 DIAGNOSIS — M542 Cervicalgia: Secondary | ICD-10-CM | POA: Diagnosis not present

## 2020-05-27 DIAGNOSIS — F4310 Post-traumatic stress disorder, unspecified: Secondary | ICD-10-CM | POA: Diagnosis not present

## 2020-05-27 DIAGNOSIS — M25561 Pain in right knee: Secondary | ICD-10-CM | POA: Diagnosis not present

## 2020-05-27 DIAGNOSIS — M791 Myalgia, unspecified site: Secondary | ICD-10-CM | POA: Diagnosis not present

## 2020-05-28 DIAGNOSIS — B373 Candidiasis of vulva and vagina: Secondary | ICD-10-CM | POA: Diagnosis not present

## 2020-05-28 DIAGNOSIS — R350 Frequency of micturition: Secondary | ICD-10-CM | POA: Diagnosis not present

## 2020-05-28 DIAGNOSIS — N3000 Acute cystitis without hematuria: Secondary | ICD-10-CM | POA: Diagnosis not present

## 2020-05-28 DIAGNOSIS — Z202 Contact with and (suspected) exposure to infections with a predominantly sexual mode of transmission: Secondary | ICD-10-CM | POA: Diagnosis not present

## 2020-05-31 DIAGNOSIS — M542 Cervicalgia: Secondary | ICD-10-CM | POA: Diagnosis not present

## 2020-05-31 DIAGNOSIS — M791 Myalgia, unspecified site: Secondary | ICD-10-CM | POA: Diagnosis not present

## 2020-05-31 DIAGNOSIS — M25561 Pain in right knee: Secondary | ICD-10-CM | POA: Diagnosis not present

## 2020-06-03 DIAGNOSIS — F4310 Post-traumatic stress disorder, unspecified: Secondary | ICD-10-CM | POA: Diagnosis not present

## 2020-06-06 DIAGNOSIS — F4310 Post-traumatic stress disorder, unspecified: Secondary | ICD-10-CM | POA: Diagnosis not present

## 2020-06-10 DIAGNOSIS — F4310 Post-traumatic stress disorder, unspecified: Secondary | ICD-10-CM | POA: Diagnosis not present

## 2020-06-17 DIAGNOSIS — F4310 Post-traumatic stress disorder, unspecified: Secondary | ICD-10-CM | POA: Diagnosis not present

## 2020-06-20 DIAGNOSIS — F4310 Post-traumatic stress disorder, unspecified: Secondary | ICD-10-CM | POA: Diagnosis not present

## 2020-06-20 DIAGNOSIS — J302 Other seasonal allergic rhinitis: Secondary | ICD-10-CM | POA: Diagnosis not present

## 2020-06-24 DIAGNOSIS — M791 Myalgia, unspecified site: Secondary | ICD-10-CM | POA: Diagnosis not present

## 2020-06-24 DIAGNOSIS — M25561 Pain in right knee: Secondary | ICD-10-CM | POA: Diagnosis not present

## 2020-06-24 DIAGNOSIS — M542 Cervicalgia: Secondary | ICD-10-CM | POA: Diagnosis not present

## 2020-06-24 DIAGNOSIS — F4310 Post-traumatic stress disorder, unspecified: Secondary | ICD-10-CM | POA: Diagnosis not present

## 2020-06-25 DIAGNOSIS — L293 Anogenital pruritus, unspecified: Secondary | ICD-10-CM | POA: Diagnosis not present

## 2020-06-28 DIAGNOSIS — M542 Cervicalgia: Secondary | ICD-10-CM | POA: Diagnosis not present

## 2020-06-28 DIAGNOSIS — M791 Myalgia, unspecified site: Secondary | ICD-10-CM | POA: Diagnosis not present

## 2020-06-28 DIAGNOSIS — M25561 Pain in right knee: Secondary | ICD-10-CM | POA: Diagnosis not present

## 2020-07-01 DIAGNOSIS — M791 Myalgia, unspecified site: Secondary | ICD-10-CM | POA: Diagnosis not present

## 2020-07-01 DIAGNOSIS — M25561 Pain in right knee: Secondary | ICD-10-CM | POA: Diagnosis not present

## 2020-07-01 DIAGNOSIS — F4310 Post-traumatic stress disorder, unspecified: Secondary | ICD-10-CM | POA: Diagnosis not present

## 2020-07-01 DIAGNOSIS — M542 Cervicalgia: Secondary | ICD-10-CM | POA: Diagnosis not present

## 2020-07-04 DIAGNOSIS — F4310 Post-traumatic stress disorder, unspecified: Secondary | ICD-10-CM | POA: Diagnosis not present

## 2020-07-05 DIAGNOSIS — M791 Myalgia, unspecified site: Secondary | ICD-10-CM | POA: Diagnosis not present

## 2020-07-05 DIAGNOSIS — M542 Cervicalgia: Secondary | ICD-10-CM | POA: Diagnosis not present

## 2020-07-05 DIAGNOSIS — M25561 Pain in right knee: Secondary | ICD-10-CM | POA: Diagnosis not present

## 2020-07-08 DIAGNOSIS — F4310 Post-traumatic stress disorder, unspecified: Secondary | ICD-10-CM | POA: Diagnosis not present

## 2020-07-08 DIAGNOSIS — M542 Cervicalgia: Secondary | ICD-10-CM | POA: Diagnosis not present

## 2020-07-08 DIAGNOSIS — M25561 Pain in right knee: Secondary | ICD-10-CM | POA: Diagnosis not present

## 2020-07-08 DIAGNOSIS — M791 Myalgia, unspecified site: Secondary | ICD-10-CM | POA: Diagnosis not present

## 2020-07-12 DIAGNOSIS — F4310 Post-traumatic stress disorder, unspecified: Secondary | ICD-10-CM | POA: Diagnosis not present

## 2020-07-15 DIAGNOSIS — F4310 Post-traumatic stress disorder, unspecified: Secondary | ICD-10-CM | POA: Diagnosis not present

## 2020-07-17 DIAGNOSIS — N898 Other specified noninflammatory disorders of vagina: Secondary | ICD-10-CM | POA: Diagnosis not present

## 2020-07-18 DIAGNOSIS — F4310 Post-traumatic stress disorder, unspecified: Secondary | ICD-10-CM | POA: Diagnosis not present

## 2020-07-19 DIAGNOSIS — M25561 Pain in right knee: Secondary | ICD-10-CM | POA: Diagnosis not present

## 2020-07-19 DIAGNOSIS — M791 Myalgia, unspecified site: Secondary | ICD-10-CM | POA: Diagnosis not present

## 2020-07-19 DIAGNOSIS — M542 Cervicalgia: Secondary | ICD-10-CM | POA: Diagnosis not present

## 2020-07-25 DIAGNOSIS — F4310 Post-traumatic stress disorder, unspecified: Secondary | ICD-10-CM | POA: Diagnosis not present

## 2020-07-25 DIAGNOSIS — L700 Acne vulgaris: Secondary | ICD-10-CM | POA: Diagnosis not present

## 2020-07-25 DIAGNOSIS — L219 Seborrheic dermatitis, unspecified: Secondary | ICD-10-CM | POA: Diagnosis not present

## 2020-07-25 DIAGNOSIS — L658 Other specified nonscarring hair loss: Secondary | ICD-10-CM | POA: Diagnosis not present

## 2020-07-30 DIAGNOSIS — R519 Headache, unspecified: Secondary | ICD-10-CM | POA: Diagnosis not present

## 2020-07-30 DIAGNOSIS — R42 Dizziness and giddiness: Secondary | ICD-10-CM | POA: Diagnosis not present

## 2020-08-01 DIAGNOSIS — F4310 Post-traumatic stress disorder, unspecified: Secondary | ICD-10-CM | POA: Diagnosis not present

## 2020-08-16 DIAGNOSIS — M542 Cervicalgia: Secondary | ICD-10-CM | POA: Diagnosis not present

## 2020-08-16 DIAGNOSIS — M25561 Pain in right knee: Secondary | ICD-10-CM | POA: Diagnosis not present

## 2020-08-16 DIAGNOSIS — M791 Myalgia, unspecified site: Secondary | ICD-10-CM | POA: Diagnosis not present

## 2020-08-19 DIAGNOSIS — F4310 Post-traumatic stress disorder, unspecified: Secondary | ICD-10-CM | POA: Diagnosis not present

## 2020-08-25 DIAGNOSIS — R1013 Epigastric pain: Secondary | ICD-10-CM | POA: Diagnosis not present

## 2020-08-25 DIAGNOSIS — R112 Nausea with vomiting, unspecified: Secondary | ICD-10-CM | POA: Diagnosis not present

## 2020-08-26 DIAGNOSIS — F4310 Post-traumatic stress disorder, unspecified: Secondary | ICD-10-CM | POA: Diagnosis not present

## 2020-09-02 DIAGNOSIS — F4310 Post-traumatic stress disorder, unspecified: Secondary | ICD-10-CM | POA: Diagnosis not present

## 2020-09-09 DIAGNOSIS — F4310 Post-traumatic stress disorder, unspecified: Secondary | ICD-10-CM | POA: Diagnosis not present

## 2020-09-16 DIAGNOSIS — F4310 Post-traumatic stress disorder, unspecified: Secondary | ICD-10-CM | POA: Diagnosis not present

## 2020-09-30 DIAGNOSIS — F4310 Post-traumatic stress disorder, unspecified: Secondary | ICD-10-CM | POA: Diagnosis not present

## 2020-10-07 DIAGNOSIS — L700 Acne vulgaris: Secondary | ICD-10-CM | POA: Diagnosis not present

## 2020-10-07 DIAGNOSIS — F4310 Post-traumatic stress disorder, unspecified: Secondary | ICD-10-CM | POA: Diagnosis not present

## 2020-10-07 DIAGNOSIS — L810 Postinflammatory hyperpigmentation: Secondary | ICD-10-CM | POA: Diagnosis not present

## 2020-10-07 DIAGNOSIS — L658 Other specified nonscarring hair loss: Secondary | ICD-10-CM | POA: Diagnosis not present

## 2020-10-14 DIAGNOSIS — F4310 Post-traumatic stress disorder, unspecified: Secondary | ICD-10-CM | POA: Diagnosis not present

## 2020-10-21 DIAGNOSIS — F4310 Post-traumatic stress disorder, unspecified: Secondary | ICD-10-CM | POA: Diagnosis not present

## 2020-10-28 DIAGNOSIS — F4310 Post-traumatic stress disorder, unspecified: Secondary | ICD-10-CM | POA: Diagnosis not present

## 2020-11-05 DIAGNOSIS — F4310 Post-traumatic stress disorder, unspecified: Secondary | ICD-10-CM | POA: Diagnosis not present

## 2020-11-11 DIAGNOSIS — F4310 Post-traumatic stress disorder, unspecified: Secondary | ICD-10-CM | POA: Diagnosis not present

## 2020-11-18 DIAGNOSIS — F4310 Post-traumatic stress disorder, unspecified: Secondary | ICD-10-CM | POA: Diagnosis not present

## 2020-11-21 DIAGNOSIS — G44209 Tension-type headache, unspecified, not intractable: Secondary | ICD-10-CM | POA: Diagnosis not present

## 2020-11-21 DIAGNOSIS — J301 Allergic rhinitis due to pollen: Secondary | ICD-10-CM | POA: Diagnosis not present

## 2020-11-25 DIAGNOSIS — F4310 Post-traumatic stress disorder, unspecified: Secondary | ICD-10-CM | POA: Diagnosis not present

## 2020-11-25 DIAGNOSIS — Z20822 Contact with and (suspected) exposure to covid-19: Secondary | ICD-10-CM | POA: Diagnosis not present

## 2020-11-26 DIAGNOSIS — R0981 Nasal congestion: Secondary | ICD-10-CM | POA: Diagnosis not present

## 2020-11-26 DIAGNOSIS — H6983 Other specified disorders of Eustachian tube, bilateral: Secondary | ICD-10-CM | POA: Diagnosis not present

## 2020-12-09 DIAGNOSIS — F4310 Post-traumatic stress disorder, unspecified: Secondary | ICD-10-CM | POA: Diagnosis not present

## 2020-12-16 DIAGNOSIS — F4310 Post-traumatic stress disorder, unspecified: Secondary | ICD-10-CM | POA: Diagnosis not present

## 2020-12-25 ENCOUNTER — Emergency Department (HOSPITAL_COMMUNITY): Payer: BC Managed Care – PPO

## 2020-12-25 ENCOUNTER — Encounter (HOSPITAL_COMMUNITY): Payer: Self-pay

## 2020-12-25 ENCOUNTER — Other Ambulatory Visit: Payer: Self-pay

## 2020-12-25 ENCOUNTER — Emergency Department (HOSPITAL_COMMUNITY)
Admission: EM | Admit: 2020-12-25 | Discharge: 2020-12-25 | Disposition: A | Discharge status: Home / Self Care | Payer: BC Managed Care – PPO | Attending: Emergency Medicine | Admitting: Emergency Medicine

## 2020-12-25 DIAGNOSIS — R1032 Left lower quadrant pain: Secondary | ICD-10-CM | POA: Diagnosis not present

## 2020-12-25 DIAGNOSIS — K5792 Diverticulitis of intestine, part unspecified, without perforation or abscess without bleeding: Secondary | ICD-10-CM | POA: Insufficient documentation

## 2020-12-25 DIAGNOSIS — J45909 Unspecified asthma, uncomplicated: Secondary | ICD-10-CM | POA: Insufficient documentation

## 2020-12-25 DIAGNOSIS — R3915 Urgency of urination: Secondary | ICD-10-CM | POA: Diagnosis not present

## 2020-12-25 DIAGNOSIS — R109 Unspecified abdominal pain: Secondary | ICD-10-CM | POA: Diagnosis not present

## 2020-12-25 LAB — COMPREHENSIVE METABOLIC PANEL WITH GFR
ALT: 10 U/L (ref 0–44)
AST: 16 U/L (ref 15–41)
Albumin: 3.4 g/dL — ABNORMAL LOW (ref 3.5–5.0)
Alkaline Phosphatase: 54 U/L (ref 38–126)
Anion gap: 7 (ref 5–15)
BUN: 5 mg/dL — ABNORMAL LOW (ref 6–20)
CO2: 23 mmol/L (ref 22–32)
Calcium: 8.9 mg/dL (ref 8.9–10.3)
Chloride: 107 mmol/L (ref 98–111)
Creatinine, Ser: 0.63 mg/dL (ref 0.44–1.00)
GFR, Estimated: >60 mL/min
Glucose, Bld: 84 mg/dL (ref 70–99)
Potassium: 4.2 mmol/L (ref 3.5–5.1)
Sodium: 137 mmol/L (ref 135–145)
Total Bilirubin: 0.8 mg/dL (ref 0.3–1.2)
Total Protein: 7 g/dL (ref 6.5–8.1)

## 2020-12-25 LAB — CBC
HCT: 38.7 % (ref 36.0–46.0)
Hemoglobin: 12.3 g/dL (ref 12.0–15.0)
MCH: 29.2 pg (ref 26.0–34.0)
MCHC: 31.8 g/dL (ref 30.0–36.0)
MCV: 91.9 fL (ref 80.0–100.0)
Platelets: 264 K/uL (ref 150–400)
RBC: 4.21 MIL/uL (ref 3.87–5.11)
RDW: 12.7 % (ref 11.5–15.5)
WBC: 7.5 K/uL (ref 4.0–10.5)
nRBC: 0 % (ref 0.0–0.2)

## 2020-12-25 LAB — LIPASE, BLOOD: Lipase: 19 U/L (ref 11–51)

## 2020-12-25 LAB — URINALYSIS, ROUTINE W REFLEX MICROSCOPIC
Bilirubin Urine: NEGATIVE
Glucose, UA: NEGATIVE mg/dL
Hgb urine dipstick: NEGATIVE
Ketones, ur: 20 mg/dL — AB
Leukocytes,Ua: NEGATIVE
Nitrite: NEGATIVE
Protein, ur: NEGATIVE mg/dL
Specific Gravity, Urine: 1.011 (ref 1.005–1.030)
pH: 7 (ref 5.0–8.0)

## 2020-12-25 MED ORDER — IOHEXOL 300 MG/ML  SOLN
100.0000 mL | Freq: Once | INTRAMUSCULAR | Status: AC | PRN
  Administered 2020-12-25: 100 mL via INTRAVENOUS

## 2020-12-25 MED ORDER — MORPHINE SULFATE (PF) 4 MG/ML IV SOLN
4.0000 mg | Freq: Once | INTRAVENOUS | Status: AC
  Administered 2020-12-25: 4 mg via INTRAVENOUS
  Filled 2020-12-25: qty 1

## 2020-12-25 MED ORDER — CIPROFLOXACIN HCL 500 MG PO TABS
500.0000 mg | ORAL_TABLET | Freq: Once | ORAL | Status: AC
  Administered 2020-12-25: 500 mg via ORAL
  Filled 2020-12-25: qty 1

## 2020-12-25 MED ORDER — CIPROFLOXACIN HCL 500 MG PO TABS: 500.0000 mg | ORAL_TABLET | Freq: Two times a day (BID) | ORAL | 0 refills | Status: DC

## 2020-12-25 MED ORDER — METRONIDAZOLE 500 MG PO TABS: 500.0000 mg | ORAL_TABLET | Freq: Two times a day (BID) | ORAL | 0 refills | Status: DC

## 2020-12-25 MED ORDER — ONDANSETRON HCL 4 MG/2ML IJ SOLN
4.0000 mg | Freq: Once | INTRAMUSCULAR | Status: AC
  Administered 2020-12-25: 4 mg via INTRAVENOUS
  Filled 2020-12-25: qty 2

## 2020-12-25 MED ORDER — METRONIDAZOLE 500 MG PO TABS
500.0000 mg | ORAL_TABLET | Freq: Once | ORAL | Status: AC
Start: 2020-12-25 — End: 2020-12-25
  Administered 2020-12-25: 500 mg via ORAL
  Filled 2020-12-25: qty 1

## 2020-12-25 NOTE — ED Provider Notes (Signed)
[facility] EMERGENCY DEPARTMENT Provider Note   CSN: [id number] Arrival date & time: 12/25/20  1136     History Chief Complaint  Patient presents with  . Abdominal Pain    Jill Elliott is a 45 y.o. female.  She is complaining of lower abdominal pain that started 2 days ago.  Seem to improve with some gas medicine but recurred again at 4 AM severe in nature radiating into her back and her thighs.  Associated with nausea.  No change in bowels no urinary symptoms.  No fevers or chills.  The history is provided by the patient.  Abdominal Pain Pain location:  LLQ and RLQ Pain quality: aching and cramping   Pain radiates to:  Back, R leg and L leg Pain severity:  Severe Onset quality:  Gradual Duration:  3 days Timing:  Intermittent Progression:  Worsening Chronicity:  New Context: not trauma   Relieved by:  Nothing Worsened by:  Nothing Ineffective treatments:  Position changes Associated symptoms: nausea   Associated symptoms: no chest pain, no cough, no diarrhea, no dysuria, no fever, no hematemesis, no hematochezia, no hematuria, no melena, no shortness of breath, no sore throat, no vaginal bleeding, no vaginal discharge and no vomiting        Past Medical History:  Diagnosis Date  . Anemia   . Asthma    as teenager, no problems as adult, no inhaler  . Eczema   . Headache    menstrual   . Seasonal allergies   . SVD (spontaneous vaginal delivery) 12/28/199   x 1 at 34 wks stillborn  . Umbilical hernia     Patient Active Problem List   Diagnosis Date Noted  . Fibroid, uterine 11/18/2017    Past Surgical History:  Procedure Laterality Date  . ABDOMINAL HYSTERECTOMY    . BREAST SURGERY     reduction  . COLONOSCOPY    . DILATION AND CURETTAGE OF UTERUS     w/myomectomy  . LASER ABLATION  2015   uterine  . LIPOSUCTION TRUNK     tummy  . MYOMECTOMY VAGINAL APPROACH     x 2  . ROBOTIC ASSISTED TOTAL HYSTERECTOMY WITH SALPINGECTOMY  Bilateral 11/18/2017   Procedure: ROBOTIC ASSISTED TOTAL HYSTERECTOMY WITH SALPINGECTOMY;  Surgeon: Delsa Bern, MD;  Location: [facility];  Service: Gynecology;  Laterality: Bilateral;  . WISDOM TOOTH EXTRACTION       OB History    Gravida  1   Para  1   Term      Preterm  1   AB      Living        SAB      IAB      Ectopic      Multiple      Live Births  0           Family History  Problem Relation Age of Onset  . Thyroid disease Mother   . Allergic rhinitis Neg Hx   . Angioedema Neg Hx   . Asthma Neg Hx   . Eczema Neg Hx   . Immunodeficiency Neg Hx   . Urticaria Neg Hx     Social History   Tobacco Use  . Smoking status: Never Smoker  . Smokeless tobacco: Never Used  Vaping Use  . Vaping Use: Never used  Substance Use Topics  . Alcohol use: No  . Drug use: No    Home Medications Prior to Admission medications   Medication  Sig Start Date End Date Taking? Authorizing Provider  azelastine (ASTELIN) 0.1 % nasal spray Place 2 sprays into both nostrils 2 (two) times daily. Use in each nostril as directed Patient not taking: Reported on 11/11/2017 08/13/17   Kennith Gain, MD  fluticasone [facility]) 50 MCG/ACT nasal spray Place 2 sprays into both nostrils daily. Patient not taking: Reported on 11/11/2017 08/13/17   Kennith Gain, MD  ibuprofen (ADVIL,MOTRIN) 800 MG tablet Take 1 tablet (800 mg total) by mouth every 8 (eight) hours as needed for mild pain, moderate pain or cramping. Take tablet after a full meal. 12/06/17   Waymon Amato, MD  levofloxacin (LEVAQUIN) 500 MG tablet Take 500 mg by mouth daily.    [provider]  meclizine (ANTIVERT) 25 MG tablet 1 TABLET BY MOUTH EVERY 6 HOURS AS NEEDED VERTIGO 07/08/17   [provider]  oxyCODONE-acetaminophen (PERCOCET) 10-325 MG tablet Take 1 tablet by mouth every 4 (four) hours as needed for pain.    [provider]  TURMERIC PO Take 1 tablet by mouth 4 (four)  times a week.    [provider]  VITAMIN A PO Take 1 tablet by mouth daily.    [provider]  VITAMIN E PO Take 1 tablet by mouth daily.    [provider]  VITAMIN K PO Take 1 tablet by mouth daily.    [provider]    Allergies    Penicillins and Shellfish allergy  Review of Systems   Review of Systems  Constitutional: Negative for fever.  HENT: Negative for sore throat.   Eyes: Negative for visual disturbance.  Respiratory: Negative for cough and shortness of breath.   Cardiovascular: Negative for chest pain.  Gastrointestinal: Positive for abdominal pain and nausea. Negative for diarrhea, hematemesis, hematochezia, melena and vomiting.  Genitourinary: Negative for dysuria, hematuria, vaginal bleeding and vaginal discharge.  Musculoskeletal: Positive for back pain.  Skin: Negative for rash.  Neurological: Negative for headaches.    Physical Exam Updated Vital Signs BP 118/90   Pulse 77   Temp 98.5 F (36.9 C) (Oral)   Resp 15   Ht 5\' 4"  (1.626 m)   Wt 74.8 kg   LMP 11/18/2017   SpO2 100%   BMI 28.32 kg/m   Physical Exam Vitals and nursing note reviewed.  Constitutional:      General: She is not in acute distress.    Appearance: Normal appearance. She is well-developed and well-nourished.  HENT:     Head: Normocephalic and atraumatic.  Eyes:     Conjunctiva/sclera: Conjunctivae normal.  Cardiovascular:     Rate and Rhythm: Normal rate and regular rhythm.     Heart sounds: No murmur heard.   Pulmonary:     Effort: Pulmonary effort is normal. No respiratory distress.     Breath sounds: Normal breath sounds.  Abdominal:     Palpations: Abdomen is soft.     Tenderness: There is no abdominal tenderness.  Musculoskeletal:        General: No deformity, signs of injury or edema. Normal range of motion.     Cervical back: Neck supple.  Skin:    General: Skin is warm and dry.     Capillary Refill: Capillary refill takes  less than 2 seconds.  Neurological:     General: No focal deficit present.     Mental Status: She is alert.     Sensory: No sensory deficit.     Motor: No weakness.  Gait: Gait normal.  Psychiatric:        Mood and Affect: Mood and affect normal.     ED Results / Procedures / Treatments   Labs (all labs ordered are listed, but only abnormal results are displayed) Labs Reviewed  COMPREHENSIVE METABOLIC PANEL - Abnormal; Notable for the following components:      Result Value   BUN 5 (*)    Albumin 3.4 (*)    All other components within normal limits  URINALYSIS, ROUTINE W REFLEX MICROSCOPIC - Abnormal; Notable for the following components:   APPearance HAZY (*)    Ketones, ur 20 (*)    All other components within normal limits  LIPASE, BLOOD  CBC    EKG None  Radiology CT Abdomen Pelvis W Contrast  Result Date: 12/25/2020 CLINICAL DATA:  Sudden onset lower abdominal pain in the middle of the night. History of hysterectomy. Diverticulitis suspected. EXAM: CT ABDOMEN AND PELVIS WITH CONTRAST TECHNIQUE: Multidetector CT imaging of the abdomen and pelvis was performed using the standard protocol following bolus administration of intravenous contrast. CONTRAST:  167mL OMNIPAQUE IOHEXOL 300 MG/ML  SOLN COMPARISON:  Ct abd/pelvis 11/28/17 FINDINGS: Lower chest: Bilateral lower lobe subsegmental atelectasis. Hepatobiliary: No focal liver abnormality. No gallstones, gallbladder wall thickening, or pericholecystic fluid. No biliary dilatation. Pancreas: Unremarkable. No pancreatic ductal dilatation or surrounding inflammatory changes. Spleen: Normal in size without focal abnormality. Adrenals/Urinary Tract: Adrenal glands are unremarkable. Kidneys are normal, without renal calculi, focal lesion, or hydronephrosis. Urinary bladder is unremarkable. Stomach/Bowel: Stomach is within normal limits. Appendix appears normal. No evidence of small bowel wall thickening, distention, or inflammatory  changes. Few scattered colonic diverticula. Tiny diverticula along the distal descending colon/sigmoid colon junction with associated pericolonic fat stranding. Vascular/Lymphatic: No significant vascular findings are present. No enlarged abdominal or pelvic lymph nodes. Reproductive: Status post hysterectomy. No adnexal masses. Other: No intraperitoneal free fluid. No intraperitoneal free gas. No organized fluid collection. Musculoskeletal: No acute or significant osseous findings. IMPRESSION: Few scattered colonic diverticulosis with mild/early uncomplicated acute diverticulitis of the distal descending/sigmoid colon junction. Electronically Signed   By: Iven Finn M.D.   On: 12/25/2020 18:26    Procedures Procedures (including critical care time)  Medications Ordered in ED Medications  morphine 4 MG/ML injection 4 mg (4 mg Intravenous Given 12/25/20 1717)  ondansetron (ZOFRAN) injection 4 mg (4 mg Intravenous Given 12/25/20 1716)  iohexol (OMNIPAQUE) 300 MG/ML solution 100 mL (100 mLs Intravenous Contrast Given 12/25/20 1740)  ciprofloxacin (CIPRO) tablet 500 mg (500 mg Oral Given 12/25/20 1944)  metroNIDAZOLE (FLAGYL) tablet 500 mg (500 mg Oral Given 12/25/20 1944)    ED Course  I have reviewed the triage vital signs and the nursing notes.  Pertinent labs & imaging results that were available during my care of the patient were reviewed by me and considered in my medical decision making (see chart for details).  Clinical Course as of 12/26/20 1141  Wed Dec 25, 2020  1859 Reviewed results with patient.  She is comfortable plan for outpatient treatment of this.  Return instructions discussed.  Currently her pain is 1 out of 10. [MB]    Clinical Course User Index [MB] Hayden Rasmussen, MD   MDM Rules/Calculators/A&P                         This patient complains of abdominal pain; this involves an extensive number of treatment Options and is a complaint that carries with it  a high risk  of complications and Morbidity. The differential includes UTI, renal colic, diverticulitis, colitis, ovarian cyst, ovarian torsion  I ordered, reviewed and interpreted labs, which included CBC with normal white count normal hemoglobin, chemistries normal, urinalysis without signs of infection I ordered medication IV fluids IV pain medication oral antibiotics I ordered imaging studies which included CT abdomen pelvis and I independently    visualized and interpreted imaging which showed acute uncomplicated diverticulitis Previous records obtained and reviewed in epic, no recent admissions  After the interventions stated above, I reevaluated the patient and found patient's pain to be controlled.  I reviewed her work-up with her.  She is comfortable plan for discharge with oral antibiotics.  Return instructions discussed.   Final Clinical Impression(s) / ED Diagnoses Final diagnoses:  Acute diverticulitis    Rx / DC Orders ED Discharge Orders         Ordered    ciprofloxacin (CIPRO) 500 MG tablet  2 times daily        12/25/20 1903    metroNIDAZOLE (FLAGYL) 500 MG tablet  2 times daily        12/25/20 1903           Hayden Rasmussen, MD 12/26/20 1143

## 2020-12-25 NOTE — Discharge Instructions (Addendum)
You were seen in the emergency department for lower abdominal pain.  You had blood work urinalysis and a CT scan of your abdomen and pelvis.  You have acute uncomplicated diverticulitis.  This is usually treated with antibiotics and a clear liquid diet advancing as tolerated.  Please follow-up with your regular doctor.  Return to the emergency department if any high fever or worsening symptoms.

## 2020-12-25 NOTE — ED Triage Notes (Signed)
Pt reports lower abd pain that woke her up out of her sleep this morning, radiates to her back, denies any vaginal bleeding or discharge, hx of hysterectomy.

## 2020-12-27 DIAGNOSIS — F4310 Post-traumatic stress disorder, unspecified: Secondary | ICD-10-CM | POA: Diagnosis not present

## 2021-01-02 DIAGNOSIS — B349 Viral infection, unspecified: Secondary | ICD-10-CM | POA: Diagnosis not present

## 2021-01-03 DIAGNOSIS — F4310 Post-traumatic stress disorder, unspecified: Secondary | ICD-10-CM | POA: Diagnosis not present

## 2021-01-08 DIAGNOSIS — J4521 Mild intermittent asthma with (acute) exacerbation: Secondary | ICD-10-CM | POA: Diagnosis not present

## 2021-01-08 DIAGNOSIS — U071 COVID-19: Secondary | ICD-10-CM | POA: Diagnosis not present

## 2021-01-08 DIAGNOSIS — J0110 Acute frontal sinusitis, unspecified: Secondary | ICD-10-CM | POA: Diagnosis not present

## 2021-01-10 DIAGNOSIS — F4310 Post-traumatic stress disorder, unspecified: Secondary | ICD-10-CM | POA: Diagnosis not present

## 2021-01-16 DIAGNOSIS — U071 COVID-19: Secondary | ICD-10-CM | POA: Diagnosis not present

## 2021-01-21 DIAGNOSIS — T782XXA Anaphylactic shock, unspecified, initial encounter: Secondary | ICD-10-CM | POA: Diagnosis not present

## 2021-01-21 DIAGNOSIS — T7840XA Allergy, unspecified, initial encounter: Secondary | ICD-10-CM | POA: Diagnosis not present

## 2021-01-21 DIAGNOSIS — R069 Unspecified abnormalities of breathing: Secondary | ICD-10-CM | POA: Diagnosis not present

## 2021-01-21 DIAGNOSIS — L509 Urticaria, unspecified: Secondary | ICD-10-CM | POA: Diagnosis not present

## 2021-01-23 DIAGNOSIS — B379 Candidiasis, unspecified: Secondary | ICD-10-CM | POA: Diagnosis not present

## 2021-01-23 DIAGNOSIS — U099 Post covid-19 condition, unspecified: Secondary | ICD-10-CM | POA: Diagnosis not present

## 2021-01-23 DIAGNOSIS — R0981 Nasal congestion: Secondary | ICD-10-CM | POA: Diagnosis not present

## 2021-01-24 DIAGNOSIS — F4310 Post-traumatic stress disorder, unspecified: Secondary | ICD-10-CM | POA: Diagnosis not present

## 2021-02-07 DIAGNOSIS — F4310 Post-traumatic stress disorder, unspecified: Secondary | ICD-10-CM | POA: Diagnosis not present

## 2021-02-11 ENCOUNTER — Encounter (INDEPENDENT_AMBULATORY_CARE_PROVIDER_SITE_OTHER): Payer: Self-pay | Admitting: Otolaryngology

## 2021-02-11 ENCOUNTER — Ambulatory Visit (INDEPENDENT_AMBULATORY_CARE_PROVIDER_SITE_OTHER): Payer: BC Managed Care – PPO | Admitting: Otolaryngology

## 2021-02-11 ENCOUNTER — Other Ambulatory Visit: Payer: Self-pay

## 2021-02-11 VITALS — Temp 97.7°F

## 2021-02-11 DIAGNOSIS — J310 Chronic rhinitis: Secondary | ICD-10-CM

## 2021-02-11 DIAGNOSIS — J343 Hypertrophy of nasal turbinates: Secondary | ICD-10-CM

## 2021-02-11 NOTE — Progress Notes (Signed)
HPI: Jill Elliott is a 45 y.o. female who presents is referred by her PCP for evaluation of chronic nasal congestion and mucus discharge which is generally clear.  She also complains of itching in the throat and itching in the ears.  She has history of allergies and last saw an allergist about 2 years ago.  She also has history of asthma.  She has been using Flonase and saline rinses for the nasal congestion.  She is also used Mucinex.  She describes an excessive amount of postnasal drainage and pressure between her eyes. She apparently had Covid back in January and has had chronic symptoms since that time.  Initially she had some yellow-green discharge from her nose and sinuses associated with a Covid but this is now being clear but she continues to have nasal congestion and excessive mucus discharge..  Past Medical History:  Diagnosis Date  . Anemia   . Asthma    as teenager, no problems as adult, no inhaler  . Eczema   . Headache    menstrual   . Seasonal allergies   . SVD (spontaneous vaginal delivery) 12/28/199   x 1 at 34 wks stillborn  . Umbilical hernia    Past Surgical History:  Procedure Laterality Date  . ABDOMINAL HYSTERECTOMY    . BREAST SURGERY     reduction  . COLONOSCOPY    . DILATION AND CURETTAGE OF UTERUS     w/myomectomy  . LASER ABLATION  2015   uterine  . LIPOSUCTION TRUNK     tummy  . MYOMECTOMY VAGINAL APPROACH     x 2  . ROBOTIC ASSISTED TOTAL HYSTERECTOMY WITH SALPINGECTOMY Bilateral 11/18/2017   Procedure: ROBOTIC ASSISTED TOTAL HYSTERECTOMY WITH SALPINGECTOMY;  Surgeon: Delsa Bern, MD;  Location: [facility];  Service: Gynecology;  Laterality: Bilateral;  . WISDOM TOOTH EXTRACTION     Social History   Socioeconomic History  . Marital status: Single    Spouse name: Not on file  . Number of children: Not on file  . Years of education: Not on file  . Highest education level: Not on file  Occupational History  . Not on file  Tobacco Use  . Smoking  status: Never Smoker  . Smokeless tobacco: Never Used  Vaping Use  . Vaping Use: Never used  Substance and Sexual Activity  . Alcohol use: No  . Drug use: No  . Sexual activity: Not Currently    Birth control/protection: None, Surgical  Other Topics Concern  . Not on file  Social History Narrative  . Not on file   Social Determinants of Health   Financial Resource Strain: Not on file  Food Insecurity: Not on file  Transportation Needs: Not on file  Physical Activity: Not on file  Stress: Not on file  Social Connections: Not on file   Family History  Problem Relation Age of Onset  . Thyroid disease Mother   . Allergic rhinitis Neg Hx   . Angioedema Neg Hx   . Asthma Neg Hx   . Eczema Neg Hx   . Immunodeficiency Neg Hx   . Urticaria Neg Hx    Allergies  Allergen Reactions  . Penicillins     Chest and stomach pain Has patient had a PCN reaction causing immediate rash, facial/tongue/throat swelling, SOB or lightheadedness with hypotension: Unknown Has patient had a PCN reaction causing severe rash involving mucus membranes or skin necrosis: Unknown Has patient had a PCN reaction that required hospitalization: No Has patient  had a PCN reaction occurring within the last 10 years: Unknown If all of the above answers are "NO", then may proceed with Cephalosporin use.    Marland Kitchen Fluconazole Rash  . Shellfish Allergy Swelling and Rash    Lips swell    Prior to Admission medications   Medication Sig Start Date End Date Taking? Authorizing Provider  azelastine (ASTELIN) 0.1 % nasal spray Place 2 sprays into both nostrils 2 (two) times daily. Use in each nostril as directed Patient not taking: No sig reported 08/13/17   Kennith Gain, MD  ciprofloxacin (CIPRO) 500 MG tablet Take 1 tablet (500 mg total) by mouth 2 (two) times daily. 12/25/20   Hayden Rasmussen, MD  Fluocinolone Acetonide Scalp 0.01 % OIL Apply 1 application topically as needed (scalp). 08/01/20   [provider]  fluticasone (FLONASE) 50 MCG/ACT nasal spray Place 2 sprays into both nostrils daily. 08/13/17   Kennith Gain, MD  ibuprofen (ADVIL,MOTRIN) 800 MG tablet Take 1 tablet (800 mg total) by mouth every 8 (eight) hours as needed for mild pain, moderate pain or cramping. Take tablet after a full meal. Patient not taking: Reported on 12/25/2020 12/06/17   Waymon Amato, MD  ketoconazole (NIZORAL) 2 % cream Apply 1 application topically as needed for irritation (scalp). 07/25/20   [provider]  levocetirizine (XYZAL) 5 MG tablet Take 5 mg by mouth as needed for allergies. 11/21/20   [provider]  metroNIDAZOLE (FLAGYL) 500 MG tablet Take 1 tablet (500 mg total) by mouth 2 (two) times daily. 12/25/20   Hayden Rasmussen, MD  Probiotic Product (PROBIOTIC PO) Take 1 tablet by mouth daily.    [provider]  traMADol (ULTRAM) 50 MG tablet Take 50 mg by mouth as needed for migraine. 11/21/20   [provider]  TURMERIC PO Take 1 tablet by mouth daily.    [provider]  VITAMIN E PO Take 1 tablet by mouth daily.    [provider]     Positive ROS: Otherwise negative  All other systems have been reviewed and were otherwise negative with the exception of those mentioned in the HPI and as above.  Physical Exam: Constitutional: Alert, well-appearing, no acute distress Ears: External ears without lesions or tenderness. Ear canals are clear bilaterally with intact, clear TMs bilaterally. Nasal: External nose without lesions. Septum mildly deviated to the right..  After decongesting the nose nasal endoscopy was performed and on nasal endoscopy both middle meatus regions were clear with no evidence of mucopurulent discharge.  There were no polyps noted within the nasal cavity.  The nasopharynx was clear with only clear mucus discharge. Oral: Lips and gums without lesions. Tongue and palate mucosa without lesions. Posterior  oropharynx clear.  Right tonsil slightly larger than the left tonsil but no acute exudate. Neck: No palpable adenopathy or masses Respiratory: Breathing comfortably  Skin: No facial/neck lesions or rash noted.  Procedures  Assessment: Chronic rhinitis. Patient with history of allergic rhinitis and has previously seen allergist and might benefit by seeing them again. Recent Covid infection exacerbated nasal sinus issues.  Plan: Reviewed with her concerning the medical treatment of this would be regular use of nasal steroid spray 2 sprays each nostril at night as well as use of azelastine 1 spray twice daily and refill both of these medications.  Also discussed with her concerning use of the saline rinses for the nose for the excessive mucus and possible follow-up with her allergist.  If symptoms do not improve with medical therapy could consider surgical intervention but would have to get a CT scan of the sinuses initially to evaluate this.  Presently there is no clinical evidence of active infection requiring further antibiotic therapy.   Radene Journey, MD   CC:

## 2021-02-25 DIAGNOSIS — J301 Allergic rhinitis due to pollen: Secondary | ICD-10-CM | POA: Diagnosis not present

## 2021-02-25 DIAGNOSIS — Z20822 Contact with and (suspected) exposure to covid-19: Secondary | ICD-10-CM | POA: Diagnosis not present

## 2021-02-25 DIAGNOSIS — J310 Chronic rhinitis: Secondary | ICD-10-CM | POA: Diagnosis not present

## 2021-02-25 IMAGING — US US PELVIS COMPLETE WITH TRANSVAGINAL
1 series · 14 of 25 positions shown · non-contrast
Comparison: CT abdomen pelvis 11/28/2017

CLINICAL DATA: Female pelvic pain



[Series 1: us pelvis complete with transvaginal · 0.26mm/px · 14 of 41 slices shown]
[im 1/41]
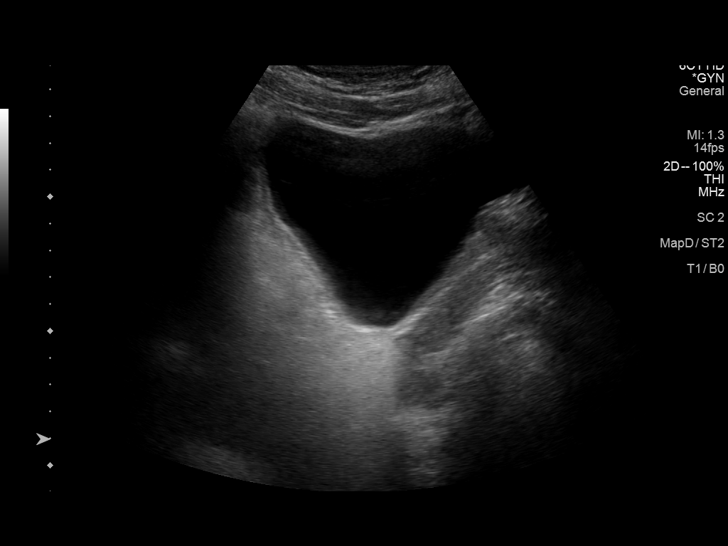
[im 4/41]
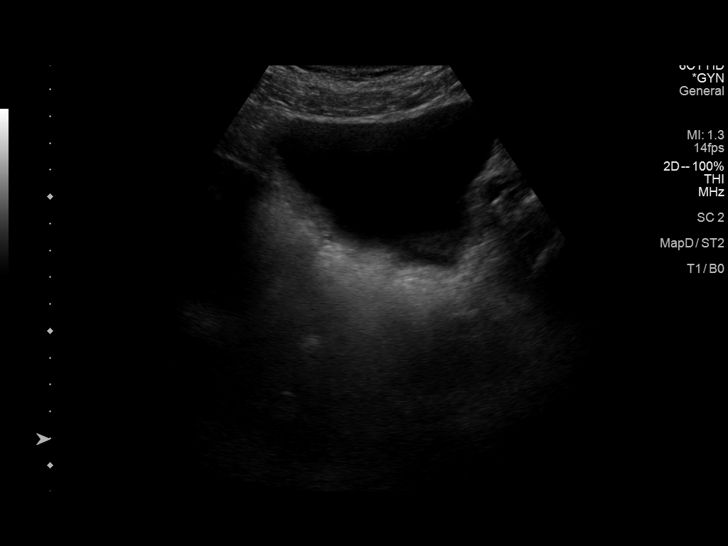
[im 7/41]
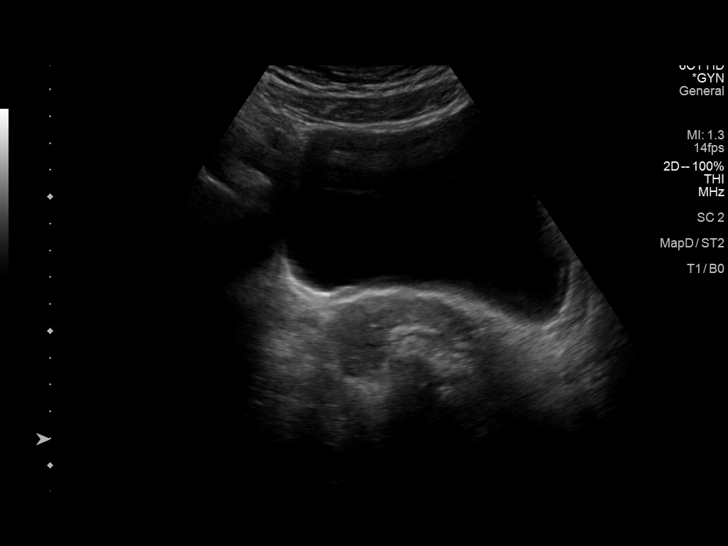
[im 11/41]
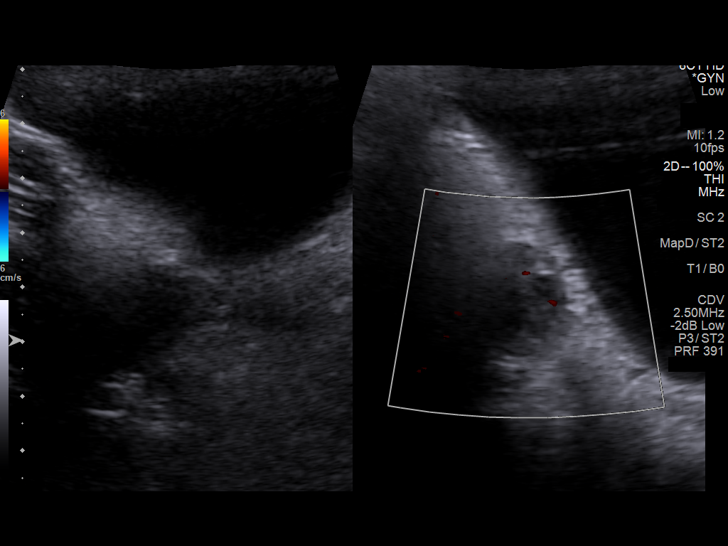
[im 14/41]
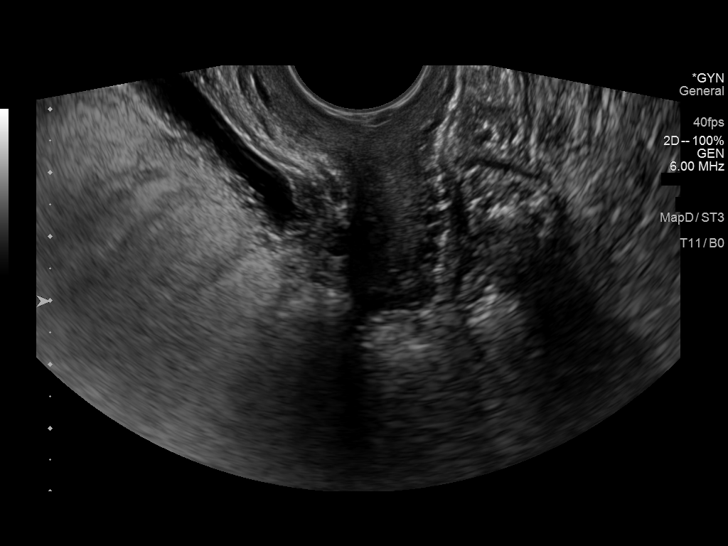
[im 16/41]
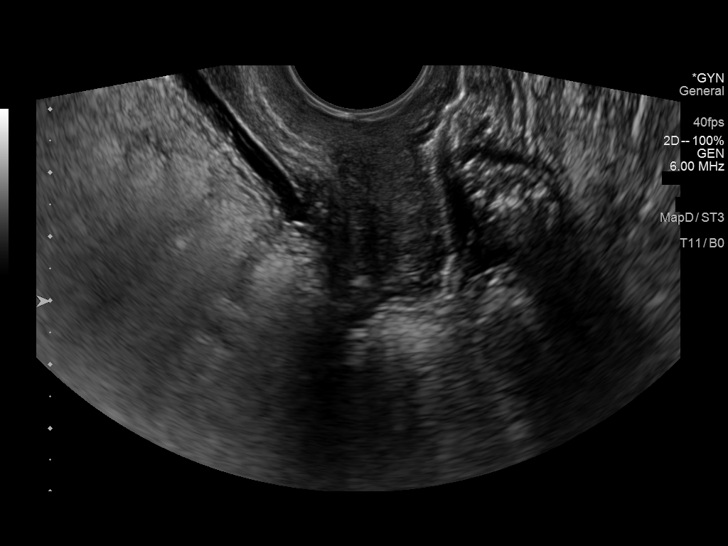
[im 19/41]
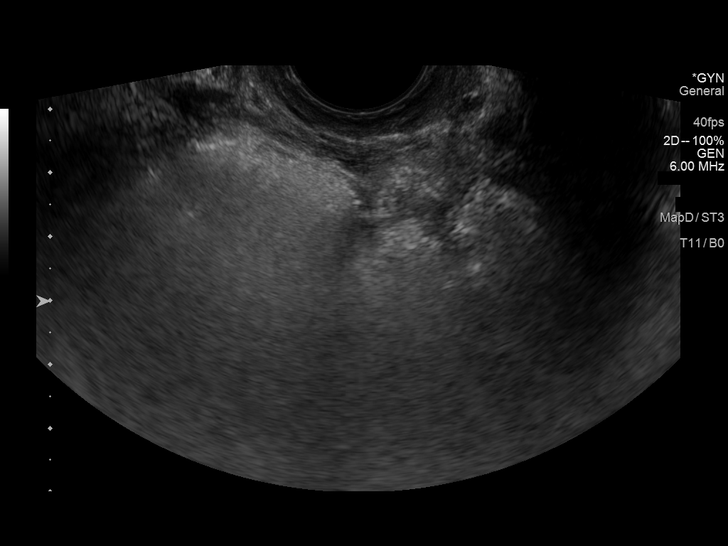
[im 22/41]
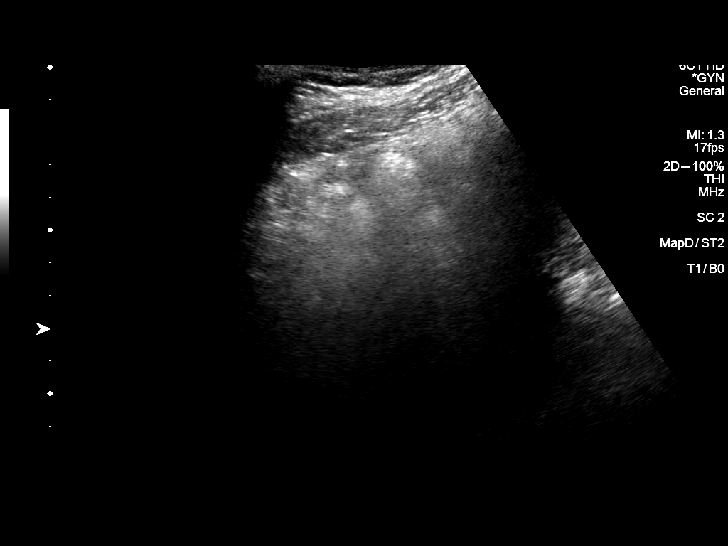
[im 26/41]
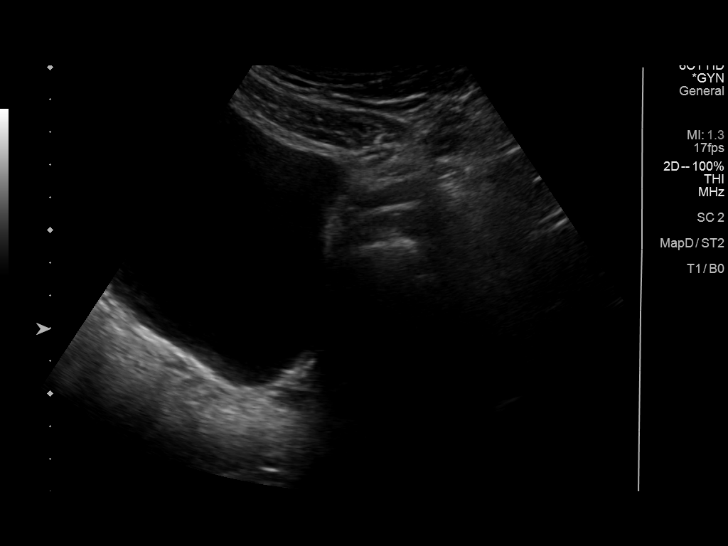
[im 27/41]
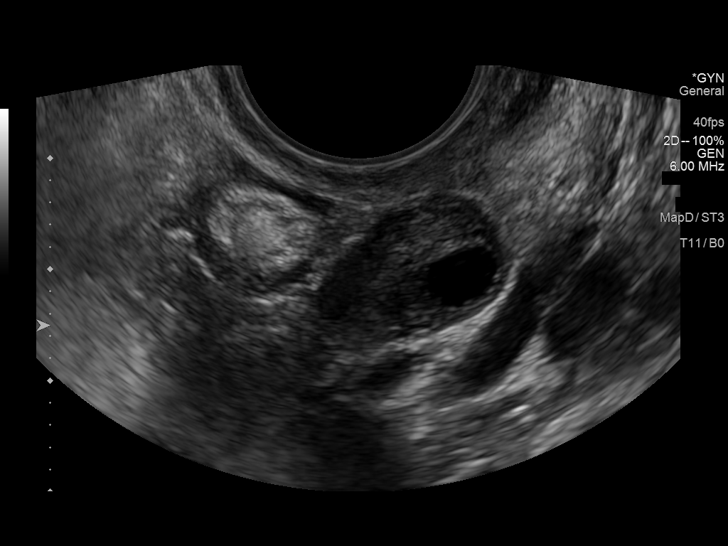
[im 31/41]
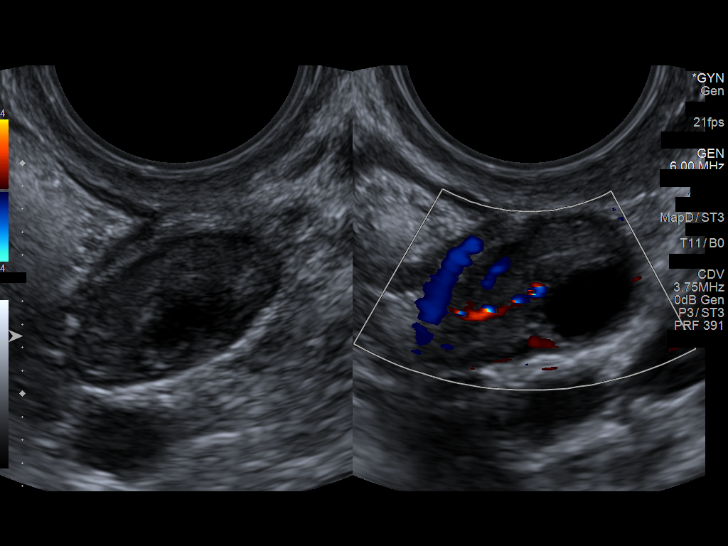
[im 34/41]
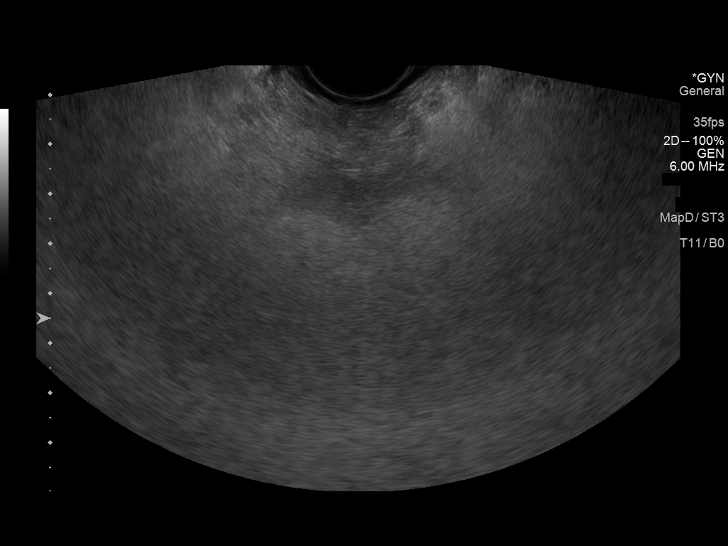
[im 37/41]
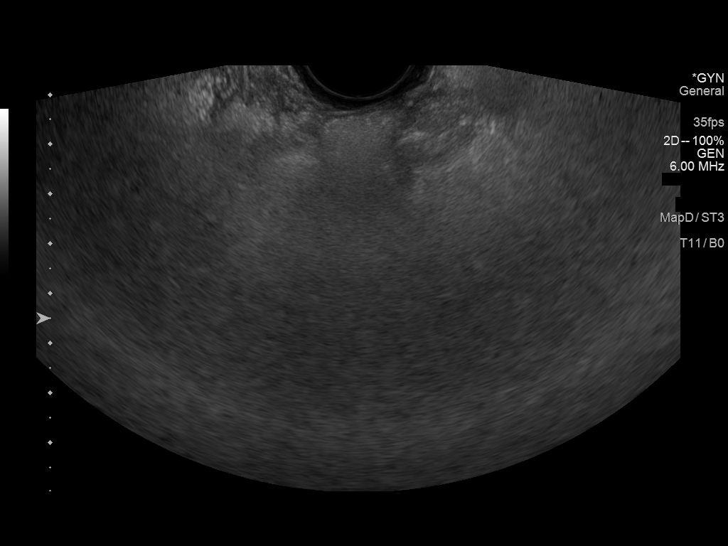
[im 41/41]
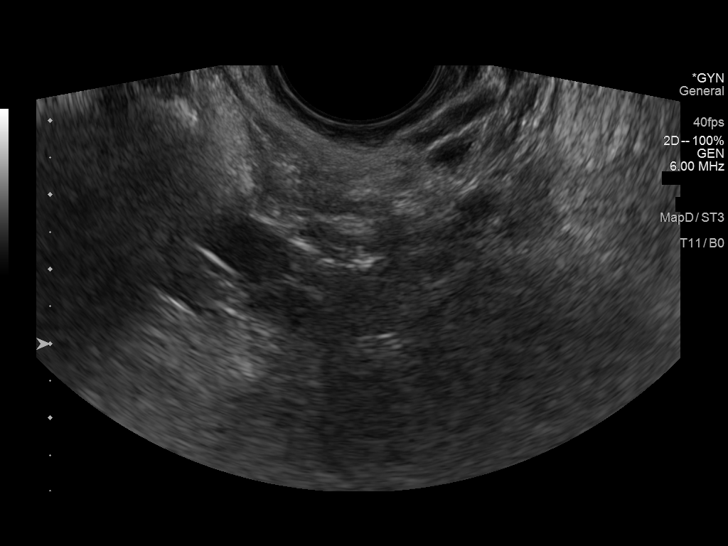

[14 of 25 positions shown; findings below may reference images not displayed]

FINDINGS: Uterus

Surgically absent

Endometrium

N/A

Right ovary

Measurements: 1.7 x 2.5 x 1.6 cm = volume: 3.7 mL. Normal morphology
without gross mass, suboptimally seen on transabdominal imaging due
to bowel.

Left ovary

Measurements: 2.1 x 2.0 x 1.1 cm = volume: 2.4 mL. Normal morphology
without mass

Other findings

No free pelvic fluid.  No adnexal masses.
IMPRESSION: Post hysterectomy.

Otherwise negative exam.

## 2021-02-26 DIAGNOSIS — Z1152 Encounter for screening for COVID-19: Secondary | ICD-10-CM | POA: Diagnosis not present

## 2021-03-19 DIAGNOSIS — J302 Other seasonal allergic rhinitis: Secondary | ICD-10-CM | POA: Diagnosis not present

## 2021-03-19 DIAGNOSIS — J301 Allergic rhinitis due to pollen: Secondary | ICD-10-CM | POA: Diagnosis not present

## 2021-03-19 DIAGNOSIS — R059 Cough, unspecified: Secondary | ICD-10-CM | POA: Diagnosis not present

## 2021-04-02 DIAGNOSIS — R079 Chest pain, unspecified: Secondary | ICD-10-CM | POA: Diagnosis not present

## 2021-04-02 DIAGNOSIS — R42 Dizziness and giddiness: Secondary | ICD-10-CM | POA: Diagnosis not present

## 2021-04-03 ENCOUNTER — Ambulatory Visit: Payer: BC Managed Care – PPO | Admitting: Allergy

## 2021-04-08 DIAGNOSIS — F4310 Post-traumatic stress disorder, unspecified: Secondary | ICD-10-CM | POA: Diagnosis not present

## 2021-04-22 DIAGNOSIS — E0789 Other specified disorders of thyroid: Secondary | ICD-10-CM | POA: Diagnosis not present

## 2021-04-22 DIAGNOSIS — R238 Other skin changes: Secondary | ICD-10-CM | POA: Diagnosis not present

## 2021-04-22 DIAGNOSIS — R110 Nausea: Secondary | ICD-10-CM | POA: Diagnosis not present

## 2021-04-22 DIAGNOSIS — D649 Anemia, unspecified: Secondary | ICD-10-CM | POA: Diagnosis not present

## 2021-04-22 DIAGNOSIS — F4310 Post-traumatic stress disorder, unspecified: Secondary | ICD-10-CM | POA: Diagnosis not present

## 2021-04-22 DIAGNOSIS — J302 Other seasonal allergic rhinitis: Secondary | ICD-10-CM | POA: Diagnosis not present

## 2021-04-24 ENCOUNTER — Telehealth: Payer: Self-pay | Admitting: Physician Assistant

## 2021-04-24 DIAGNOSIS — Z01419 Encounter for gynecological examination (general) (routine) without abnormal findings: Secondary | ICD-10-CM | POA: Diagnosis not present

## 2021-04-24 DIAGNOSIS — Z113 Encounter for screening for infections with a predominantly sexual mode of transmission: Secondary | ICD-10-CM | POA: Diagnosis not present

## 2021-04-24 DIAGNOSIS — Z6825 Body mass index (BMI) 25.0-25.9, adult: Secondary | ICD-10-CM | POA: Diagnosis not present

## 2021-04-24 NOTE — Telephone Encounter (Signed)
Received a new hem referral from Dr. Mannie Stabile for easy bruising. Jill Elliott has been cld and scheduled to see Murray Hodgkins on 5/25 at 2pm. Pt aware to arrive 20 minutes early.

## 2021-04-28 DIAGNOSIS — F4310 Post-traumatic stress disorder, unspecified: Secondary | ICD-10-CM | POA: Diagnosis not present

## 2021-04-29 ENCOUNTER — Telehealth: Payer: Self-pay | Admitting: Physician Assistant

## 2021-04-29 NOTE — Telephone Encounter (Signed)
Jill Elliott cld to reschedule her hem appt to 6/1 at 2pm w/Irene.

## 2021-04-30 ENCOUNTER — Encounter: Payer: BC Managed Care – PPO | Admitting: Physician Assistant

## 2021-04-30 ENCOUNTER — Other Ambulatory Visit: Payer: BC Managed Care – PPO

## 2021-05-01 DIAGNOSIS — F411 Generalized anxiety disorder: Secondary | ICD-10-CM | POA: Diagnosis not present

## 2021-05-01 DIAGNOSIS — F332 Major depressive disorder, recurrent severe without psychotic features: Secondary | ICD-10-CM | POA: Diagnosis not present

## 2021-05-01 DIAGNOSIS — F4310 Post-traumatic stress disorder, unspecified: Secondary | ICD-10-CM | POA: Diagnosis not present

## 2021-05-02 ENCOUNTER — Ambulatory Visit: Payer: BC Managed Care – PPO | Admitting: Allergy

## 2021-05-02 ENCOUNTER — Encounter: Payer: Self-pay | Admitting: Allergy

## 2021-05-02 ENCOUNTER — Other Ambulatory Visit: Payer: Self-pay

## 2021-05-02 VITALS — BP 106/80 | HR 80 | Resp 18 | Ht 62.5 in | Wt 160.8 lb

## 2021-05-02 DIAGNOSIS — J309 Allergic rhinitis, unspecified: Secondary | ICD-10-CM

## 2021-05-02 DIAGNOSIS — Z8616 Personal history of COVID-19: Secondary | ICD-10-CM | POA: Diagnosis not present

## 2021-05-02 DIAGNOSIS — J3089 Other allergic rhinitis: Secondary | ICD-10-CM

## 2021-05-02 DIAGNOSIS — H1013 Acute atopic conjunctivitis, bilateral: Secondary | ICD-10-CM

## 2021-05-02 DIAGNOSIS — T781XXD Other adverse food reactions, not elsewhere classified, subsequent encounter: Secondary | ICD-10-CM

## 2021-05-02 DIAGNOSIS — H1010 Acute atopic conjunctivitis, unspecified eye: Secondary | ICD-10-CM

## 2021-05-02 MED ORDER — FLUTICASONE PROPIONATE 50 MCG/ACT NA SUSP: 2.0000 | Freq: Every day | NASAL | 5 refills | Status: AC

## 2021-05-02 MED ORDER — ALBUTEROL SULFATE HFA 108 (90 BASE) MCG/ACT IN AERS: 2.0000 | INHALATION_SPRAY | RESPIRATORY_TRACT | 1 refills | Status: AC | PRN

## 2021-05-02 MED ORDER — AZELASTINE HCL 0.1 % NA SOLN: 2.0000 | Freq: Two times a day (BID) | NASAL | 5 refills | Status: DC

## 2021-05-02 MED ORDER — AZELASTINE-FLUTICASONE 137-50 MCG/ACT NA SUSP: NASAL | 5 refills | Status: DC

## 2021-05-02 NOTE — Patient Instructions (Addendum)
Allergic rhinitis     -  Allergy testing today is positive for grass pollen, weeds pollen, mold and dust mites     - Allergen avoidance measures discussed/provided     - near complete nasal obstruction.  Recommend using a nasal decongestant Afrin (Oxymetazoline) 2 sprays each nostril twice a day and use no more than 3 to 5 days at a time.  After Afrin use wait 5 to 10 minutes or until you can breathe more fully through your nose then followed up with your nasal sprays as below.  After 3 to 5 days your nasal sprays below should have enough time to keep the decongested after stopping Afrin.     - recommend use of Dymista (combination of Astelin and Flonase) use 1 spray each nostril twice a day.   If this is not covered by insurance then will prescribe tem separately     - if Levocetirizine (Xyzal) daily use is not effective enough in allergy symptom control then switch to Allegra (Fexofenadine) 180mg  daily  Adverse food reaction       - testing for shellfish and milk today is negative      - continue shellfish avoidance and minimize dairy injection to prevent worsening congestion  Past COVID illness      - have access to albuterol inhaler 2 puffs every 4-6 hours as needed for cough/wheeze/shortness of breath/chest tightness.  May use 15-20 minutes prior to activity.   Monitor frequency of use.    Follow-up as needed

## 2021-05-02 NOTE — Progress Notes (Signed)
New Patient Note  RE: Jill Elliott MRN: [id number] DOB: 1976/03/22 Date of Office Visit: 05/02/2021  Referring provider: [name], MD Primary care provider: [name], MD  Chief Complaint: allergies  History of present illness: Jill Elliott is a 45 y.o. female presenting today for consultation for allergic rhinoconjunctivitis.  She is a former patient of this practice last seen on 08/13/2017 by myself for allergic rhinitis with conjunctivitis and adverse food reaction.  At that time she had positive skin testing to grass, weeds, trees and dust mites.  She had COVID in Jan 9390 and had complications from this illness. She has trouble breathing, coughing, wheezing, chills.   She states she had to start using albuterol inhaler due to these symptoms.  She uses albuterol now only as needed and last used in April.   She feels her allergies have been worse as well with more nasal congestion and drainage.  She has astelin, flonase, levocetirizine and uses these as needed.  She states she has been advised to use these daily to help with symptom control. She saw ENT and states she had rhinoscopy done and was told she was really congested. He prescribed astelin per patient.        She states over the last 1-2 weeks she started having nausea and vomiting after foods she eats usually without issue.  She states mucus comes up with the food in emesis.   She states she was told by her PCP that it is likely related to her allergies.    She started trazodone last night for first time and felt like she was having a hard to breathing thru her nose and she used nasal saline to help.   She avoids shellfish still.  She also avoids dairy as it causes her to be very congested.  She is able to eat/drink orange without.  She is moving back to [place] over the summer.      Review of systems in past 4 weeks: Review of Systems  Constitutional: Negative.   HENT:       See HPI  Eyes: Negative.    Respiratory: Negative.   Cardiovascular: Negative.   Gastrointestinal:       See HPI  Skin: Negative.   Neurological: Negative.     All other systems negative unless noted above in HPI  Past medical history: Past Medical History:  Diagnosis Date  . Anemia   . Asthma    as teenager, no problems as adult, no inhaler  . Eczema   . Headache    menstrual   . Seasonal allergies   . SVD (spontaneous vaginal delivery) 12/28/199   x 1 at 34 wks stillborn  . Umbilical hernia     Past surgical history: Past Surgical History:  Procedure Laterality Date  . ABDOMINAL HYSTERECTOMY    . BREAST SURGERY     reduction  . COLONOSCOPY    . DILATION AND CURETTAGE OF UTERUS     w/myomectomy  . LASER ABLATION  2015   uterine  . LIPOSUCTION TRUNK     tummy  . MYOMECTOMY VAGINAL APPROACH     x 2  . ROBOTIC ASSISTED TOTAL HYSTERECTOMY WITH SALPINGECTOMY Bilateral 11/18/2017   Procedure: ROBOTIC ASSISTED TOTAL HYSTERECTOMY WITH SALPINGECTOMY;  Surgeon: Delsa Bern, MD;  Location: [facility];  Service: Gynecology;  Laterality: Bilateral;  . WISDOM TOOTH EXTRACTION      Family history:  Family History  Problem Relation Age of Onset  . Thyroid disease  Mother   . Allergic rhinitis Neg Hx   . Angioedema Neg Hx   . Asthma Neg Hx   . Eczema Neg Hx   . Immunodeficiency Neg Hx   . Urticaria Neg Hx     Social history: Lives in an apartment with carpeting with electric heating and central cooling.  No pets in the home.  There are dogs and stray cats outside the home.  There is no concern for water damage, mildew or roaches in the home.  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  She reports a smoking history from 11/25/2020 to 04/25/2021 of marijauana  Medication List: Current Outpatient Medications  Medication Sig Dispense Refill  . albuterol (VENTOLIN HFA) 108 (90 Base) MCG/ACT inhaler Inhale 2 puffs into the lungs every 4 (four) hours as needed.    Marland Kitchen FLUoxetine  (PROZAC) 10 MG capsule Take 10 mg by mouth daily.    . fluticasone (FLONASE) 50 MCG/ACT nasal spray Place 2 sprays into both nostrils daily. 16 g 5  . levocetirizine (XYZAL) 5 MG tablet Take 5 mg by mouth as needed for allergies.    Marland Kitchen meclizine (ANTIVERT) 25 MG tablet 1 tablet as needed    . Probiotic Product (PROBIOTIC PO) Take 1 tablet by mouth daily.    . traZODone (DESYREL) 50 MG tablet Take 50 mg by mouth at bedtime.    . TURMERIC PO Take 1 tablet by mouth daily.    Marland Kitchen VITAMIN E PO Take 1 tablet by mouth daily.    Marland Kitchen azelastine (ASTELIN) 0.1 % nasal spray Place 2 sprays into both nostrils 2 (two) times daily. Use in each nostril as directed (Patient not taking: No sig reported) 30 mL 5   No current facility-administered medications for this visit.    Known medication allergies: Allergies  Allergen Reactions  . Penicillins     Chest and stomach pain Has patient had a PCN reaction causing immediate rash, facial/tongue/throat swelling, SOB or lightheadedness with hypotension: Unknown Has patient had a PCN reaction causing severe rash involving mucus membranes or skin necrosis: Unknown Has patient had a PCN reaction that required hospitalization: No Has patient had a PCN reaction occurring within the last 10 years: Unknown If all of the above answers are "NO", then may proceed with Cephalosporin use.    Marland Kitchen Fluconazole Rash  . Shellfish Allergy Swelling and Rash    Lips swell      Physical examination: Blood pressure 106/80, pulse 80, resp. rate 18, height 5' 2.5" (1.588 m), weight 160 lb 12.8 oz (72.9 kg), last menstrual period 11/18/2017, SpO2 98 %.  General: Alert, interactive, in no acute distress. HEENT: PERRLA, TMs pearly gray, turbinates markedly edematous with clear discharge, post-pharynx non erythematous. Neck: Supple without lymphadenopathy. Lungs: Clear to auscultation without wheezing, rhonchi or rales. {no increased work of breathing. CV: Normal S1, S2 without  murmurs. Abdomen: Nondistended, nontender. Skin: Warm and dry, without lesions or rashes. Extremities:  No clubbing, cyanosis or edema. Neuro:   Grossly intact.  Diagnositics/Labs:  Spirometry: Attempted spirometry and this is patient's first attempt ever spirometry and she was not able to meet ATS criteria nor she able to produce enough volume to recorded FEV1.  Her FVC is 1.99 L or 71% predicted  Allergy testing: Environmental allergy skin prick testing is positive to [place], medical fescue, cocklebur, marsh elder rough, Aspergillus, dust mite DP Select food allergy skin prick testing is negative to milk, shrimp, crab, lobster, oyster and scallop Allergy  testing results were read and interpreted by provider, documented by clinical staff.   Assessment and plan:   Allergic rhinitis     -  Allergy testing today is positive for grass pollen, weeds pollen, mold and dust mites     - Allergen avoidance measures discussed/provided     - near complete nasal obstruction.  Recommend using a nasal decongestant Afrin (Oxymetazoline) 2 sprays each nostril twice a day and use no more than 3 to 5 days at a time.  After Afrin use wait 5 to 10 minutes or until you can breathe more fully through your nose then followed up with your nasal sprays as below.  After 3 to 5 days your nasal sprays below should have enough time to keep the decongested after stopping Afrin.     - recommend use of Dymista (combination of Astelin and Flonase) use 1 spray each nostril twice a day.   If this is not covered by insurance then will prescribe tem separately     - if Levocetirizine (Xyzal) daily use is not effective enough in allergy symptom control then switch to Allegra (Fexofenadine) 180mg  daily  Adverse food reaction       - testing for shellfish and milk today is negative      - continue shellfish avoidance and minimize dairy injection to prevent worsening congestion  Past COVID illness      - have access to albuterol  inhaler 2 puffs every 4-6 hours as needed for cough/wheeze/shortness of breath/chest tightness.  May use 15-20 minutes prior to activity.   Monitor frequency of use.    Follow-up as needed  I appreciate the opportunity to take part in Lainee's care. Please do not hesitate to contact me with questions.  Sincerely,   Prudy Feeler, MD Allergy/Immunology [facility] [place] [facility] [place]

## 2021-05-05 DIAGNOSIS — F4310 Post-traumatic stress disorder, unspecified: Secondary | ICD-10-CM | POA: Diagnosis not present

## 2021-05-07 ENCOUNTER — Inpatient Hospital Stay: Payer: BC Managed Care – PPO | Attending: Physician Assistant | Admitting: Physician Assistant

## 2021-05-07 ENCOUNTER — Inpatient Hospital Stay: Payer: BC Managed Care – PPO

## 2021-05-07 DIAGNOSIS — R238 Other skin changes: Secondary | ICD-10-CM | POA: Insufficient documentation

## 2021-05-07 DIAGNOSIS — Z8049 Family history of malignant neoplasm of other genital organs: Secondary | ICD-10-CM | POA: Insufficient documentation

## 2021-05-07 NOTE — Progress Notes (Deleted)
Jill [phone]   Fax:([phone]) [place] NOTE  Patient Care Team: [name], MD as PCP - General (Family Medicine)  Hematological/Oncological History 1) 04/22/2021: Labs from PCP, Dr. [name] from [facility].  -WBC 5.6, Hgb 13.2, MCV 88.8, Plt 294, PTT 28, INR 1.0, PT 10.3, Iron 104, Iron Saturation 26%, Transferrin 284, Ferritin 84.4, Vitamin B12 >2000, Folate >20  2) 05/07/2021: Establish care with Dede Query PA-C  CHIEF COMPLAINTS/PURPOSE OF CONSULTATION:  "Easy bruising "  HISTORY OF PRESENTING ILLNESS:  Jill Elliott 45 y.o. female with medical history significant for ***  On review of the previous records ***  On exam today ***  MEDICAL HISTORY:  Past Medical History:  Diagnosis Date  . Anemia   . Asthma    as teenager, no problems as adult, no inhaler  . Eczema   . Headache    menstrual   . Seasonal allergies   . SVD (spontaneous vaginal delivery) 12/28/199   x 1 at 34 wks stillborn  . Umbilical hernia     SURGICAL HISTORY: Past Surgical History:  Procedure Laterality Date  . ABDOMINAL HYSTERECTOMY    . BREAST SURGERY     reduction  . COLONOSCOPY    . DILATION AND CURETTAGE OF UTERUS     w/myomectomy  . LASER ABLATION  2015   uterine  . LIPOSUCTION TRUNK     tummy  . MYOMECTOMY VAGINAL APPROACH     x 2  . ROBOTIC ASSISTED TOTAL HYSTERECTOMY WITH SALPINGECTOMY Bilateral 11/18/2017   Procedure: ROBOTIC ASSISTED TOTAL HYSTERECTOMY WITH SALPINGECTOMY;  Surgeon: Delsa Bern, MD;  Location: [facility];  Service: Gynecology;  Laterality: Bilateral;  . WISDOM TOOTH EXTRACTION      SOCIAL HISTORY: Social History   Socioeconomic History  . Marital status: Single    Spouse name: Not on file  . Number of children: Not on file  . Years of education: Not on file  . Highest education level: Not on file  Occupational History  . Not on file  Tobacco Use  . Smoking status: Never  Smoker  . Smokeless tobacco: Never Used  Vaping Use  . Vaping Use: Never used  Substance and Sexual Activity  . Alcohol use: No  . Drug use: No  . Sexual activity: Not Currently    Birth control/protection: None, Surgical  Other Topics Concern  . Not on file  Social History Narrative  . Not on file   Social Determinants of Health   Financial Resource Strain: Not on file  Food Insecurity: Not on file  Transportation Needs: Not on file  Physical Activity: Not on file  Stress: Not on file  Social Connections: Not on file  Intimate Partner Violence: Not on file    FAMILY HISTORY: Family History  Problem Relation Age of Onset  . Thyroid disease Mother   . Allergic rhinitis Neg Hx   . Angioedema Neg Hx   . Asthma Neg Hx   . Eczema Neg Hx   . Immunodeficiency Neg Hx   . Urticaria Neg Hx     ALLERGIES:  is allergic to penicillins, fluconazole, and shellfish allergy.  MEDICATIONS:  Current Outpatient Medications  Medication Sig Dispense Refill  . albuterol (VENTOLIN HFA) 108 (90 Base) MCG/ACT inhaler Inhale 2 puffs into the lungs every 4 (four) hours as needed. 18 g 1  . azelastine (ASTELIN) 0.1 % nasal spray Place 2 sprays into both nostrils 2 (two) times daily. Use  in each nostril as directed 30 mL 5  . FLUoxetine (PROZAC) 10 MG capsule Take 10 mg by mouth daily.    . fluticasone (FLONASE) 50 MCG/ACT nasal spray Place 2 sprays into both nostrils daily. 16 g 5  . levocetirizine (XYZAL) 5 MG tablet Take 5 mg by mouth as needed for allergies.    Marland Kitchen meclizine (ANTIVERT) 25 MG tablet 1 tablet as needed    . Probiotic Product (PROBIOTIC PO) Take 1 tablet by mouth daily.    . traZODone (DESYREL) 50 MG tablet Take 50 mg by mouth at bedtime.    . TURMERIC PO Take 1 tablet by mouth daily.    Marland Kitchen VITAMIN E PO Take 1 tablet by mouth daily.     No current facility-administered medications for this visit.    REVIEW OF SYSTEMS:   Constitutional: ( - ) fevers, ( - )  chills , ( - )  night sweats Eyes: ( - ) blurriness of vision, ( - ) double vision, ( - ) watery eyes Ears, nose, mouth, throat, and face: ( - ) mucositis, ( - ) sore throat Respiratory: ( - ) cough, ( - ) dyspnea, ( - ) wheezes Cardiovascular: ( - ) palpitation, ( - ) chest discomfort, ( - ) lower extremity swelling Gastrointestinal:  ( - ) nausea, ( - ) heartburn, ( - ) change in bowel habits Skin: ( - ) abnormal skin rashes Lymphatics: ( - ) new lymphadenopathy, ( - ) easy bruising Neurological: ( - ) numbness, ( - ) tingling, ( - ) new weaknesses Behavioral/Psych: ( - ) mood change, ( - ) new changes  All other systems were reviewed with the patient and are negative.  PHYSICAL EXAMINATION: ECOG PERFORMANCE STATUS: {CHL ONC ECOG [id number]}  There were no vitals filed for this visit. There were no vitals filed for this visit.  GENERAL: well appearing *** in NAD  SKIN: skin color, texture, turgor are normal, no rashes or significant lesions EYES: conjunctiva are pink and non-injected, sclera clear OROPHARYNX: no exudate, no erythema; lips, buccal mucosa, and tongue normal  NECK: supple, non-tender LYMPH:  no palpable lymphadenopathy in the cervical, axillary or supraclavicular lymph nodes.  LUNGS: clear to auscultation and percussion with normal breathing effort HEART: regular rate & rhythm and no murmurs and no lower extremity edema ABDOMEN: soft, non-tender, non-distended, normal bowel sounds Musculoskeletal: no cyanosis of digits and no clubbing  PSYCH: alert & oriented x 3, fluent speech NEURO: no focal motor/sensory deficits  LABORATORY DATA:  I have reviewed the data as listed CBC Latest Ref Rng & Units 12/25/2020 12/06/2017 11/27/2017  WBC 4.0 - 10.5 K/uL 7.5 4.8 7.8  Hemoglobin 12.0 - 15.0 g/dL 12.3 11.6(L) 11.8(L)  Hematocrit 36.0 - 46.0 % 38.7 35.4(L) 35.1(L)  Platelets 150 - 400 K/uL 264 333 370    CMP Latest Ref Rng & Units 12/25/2020 11/16/2017  Glucose 70 - 99 mg/dL 84 89   BUN 6 - 20 mg/dL 5(L) 9  Creatinine 0.44 - 1.00 mg/dL 0.63 0.70  Sodium 135 - 145 mmol/L 137 137  Potassium 3.5 - 5.1 mmol/L 4.2 4.2  Chloride 98 - 111 mmol/L 107 104  CO2 22 - 32 mmol/L 23 24  Calcium 8.9 - 10.3 mg/dL 8.9 9.5  Total Protein 6.5 - 8.1 g/dL 7.0 -  Total Bilirubin 0.3 - 1.2 mg/dL 0.8 -  Alkaline Phos 38 - 126 U/L 54 -  AST 15 - 41 U/L 16 -  ALT 0 -  44 U/L 10 -     PATHOLOGY: ***  BLOOD FILM: *** Review of the peripheral blood smear showed normal appearing white cells with neutrophils that were appropriately lobated and granulated. There was no predominance of bi-lobed or hyper-segmented neutrophils appreciated. No Dohle bodies were noted. There was no left shifting, immature forms or blasts noted. Lymphocytes remain normal in size without any predominance of large granular lymphocytes. Red cells show no anisopoikilocytosis, macrocytes , microcytes or polychromasia. There were no schistocytes, target cells, echinocytes, acanthocytes, dacrocytes, or stomatocytes.There was no rouleaux formation, nucleated red cells, or intra-cellular inclusions noted. The platelets are normal in size, shape, and color without any clumping evident.  RADIOGRAPHIC STUDIES: I have personally reviewed the radiological images as listed and agreed with the findings in the report. No results found.  ASSESSMENT & PLAN ***  No orders of the defined types were placed in this encounter.   All questions were answered. The patient knows to call the clinic with any problems, questions or concerns.  I have spent a total of {CHL ONC TIME VISIT - [id number]} minutes of face-to-face and non-face-to-face time, preparing to see the patient, obtaining and/or reviewing separately obtained history, performing a medically appropriate examination, counseling and educating the patient, ordering medications/tests/procedures, referring and communicating with other health care professionals, documenting  clinical information in the electronic health record, independently interpreting results and communicating results to the patient, and care coordination.   Dede Query, PA-C Department of Hematology/Oncology [place] at [facility] Phone: [phone]

## 2021-05-09 DIAGNOSIS — E0789 Other specified disorders of thyroid: Secondary | ICD-10-CM | POA: Diagnosis not present

## 2021-05-13 DIAGNOSIS — F4310 Post-traumatic stress disorder, unspecified: Secondary | ICD-10-CM | POA: Diagnosis not present

## 2021-05-20 ENCOUNTER — Telehealth: Payer: Self-pay | Admitting: Physician Assistant

## 2021-05-20 NOTE — Telephone Encounter (Signed)
Moved 6/15 appt due to provider being out of office. Called and spoke with patient. Confirmed new date and time

## 2021-05-21 ENCOUNTER — Inpatient Hospital Stay: Payer: BC Managed Care – PPO | Admitting: Physician Assistant

## 2021-05-21 ENCOUNTER — Inpatient Hospital Stay: Payer: BC Managed Care – PPO

## 2021-05-22 DIAGNOSIS — F4310 Post-traumatic stress disorder, unspecified: Secondary | ICD-10-CM | POA: Diagnosis not present

## 2021-05-22 DIAGNOSIS — F332 Major depressive disorder, recurrent severe without psychotic features: Secondary | ICD-10-CM | POA: Diagnosis not present

## 2021-05-22 DIAGNOSIS — F411 Generalized anxiety disorder: Secondary | ICD-10-CM | POA: Diagnosis not present

## 2021-05-26 ENCOUNTER — Other Ambulatory Visit: Payer: Self-pay

## 2021-05-26 ENCOUNTER — Inpatient Hospital Stay: Payer: BC Managed Care – PPO

## 2021-05-26 ENCOUNTER — Inpatient Hospital Stay (HOSPITAL_BASED_OUTPATIENT_CLINIC_OR_DEPARTMENT_OTHER): Payer: BC Managed Care – PPO | Admitting: Hematology and Oncology

## 2021-05-26 VITALS — BP 114/72 | HR 83 | Temp 97.0°F | Resp 20 | Wt 161.3 lb

## 2021-05-26 DIAGNOSIS — R233 Spontaneous ecchymoses: Secondary | ICD-10-CM

## 2021-05-26 DIAGNOSIS — R238 Other skin changes: Secondary | ICD-10-CM | POA: Diagnosis not present

## 2021-05-26 DIAGNOSIS — F4310 Post-traumatic stress disorder, unspecified: Secondary | ICD-10-CM | POA: Diagnosis not present

## 2021-05-26 DIAGNOSIS — Z8049 Family history of malignant neoplasm of other genital organs: Secondary | ICD-10-CM | POA: Diagnosis not present

## 2021-05-26 LAB — CBC WITH DIFFERENTIAL (CANCER CENTER ONLY)
Abs Immature Granulocytes: 0.02 10*3/uL (ref 0.00–0.07)
Basophils Absolute: 0 10*3/uL (ref 0.0–0.1)
Basophils Relative: 1 %
Eosinophils Absolute: 0 10*3/uL (ref 0.0–0.5)
Eosinophils Relative: 1 %
HCT: 37.9 % (ref 36.0–46.0)
Hemoglobin: 12.4 g/dL (ref 12.0–15.0)
Immature Granulocytes: 1 %
Lymphocytes Relative: 49 %
Lymphs Abs: 2.2 10*3/uL (ref 0.7–4.0)
MCH: 29.4 pg (ref 26.0–34.0)
MCHC: 32.7 g/dL (ref 30.0–36.0)
MCV: 89.8 fL (ref 80.0–100.0)
Monocytes Absolute: 0.5 10*3/uL (ref 0.1–1.0)
Monocytes Relative: 10 %
Neutro Abs: 1.7 10*3/uL (ref 1.7–7.7)
Neutrophils Relative %: 38 %
Platelet Count: 315 10*3/uL (ref 150–400)
RBC: 4.22 MIL/uL (ref 3.87–5.11)
RDW: 12.2 % (ref 11.5–15.5)
WBC Count: 4.4 10*3/uL (ref 4.0–10.5)
nRBC: 0 % (ref 0.0–0.2)

## 2021-05-26 LAB — CMP (CANCER CENTER ONLY)
ALT: 12 U/L (ref 0–44)
AST: 15 U/L (ref 15–41)
Albumin: 4.1 g/dL (ref 3.5–5.0)
Alkaline Phosphatase: 57 U/L (ref 38–126)
Anion gap: 8 (ref 5–15)
BUN: 9 mg/dL (ref 6–20)
CO2: 25 mmol/L (ref 22–32)
Calcium: 9.4 mg/dL (ref 8.9–10.3)
Chloride: 105 mmol/L (ref 98–111)
Creatinine: 0.82 mg/dL (ref 0.44–1.00)
GFR, Estimated: >60 mL/min (ref >60)
Glucose, Bld: 83 mg/dL (ref 70–99)
Potassium: 4 mmol/L (ref 3.5–5.1)
Sodium: 138 mmol/L (ref 135–145)
Total Bilirubin: 0.8 mg/dL (ref 0.3–1.2)
Total Protein: 7.6 g/dL (ref 6.5–8.1)

## 2021-05-26 LAB — APTT: aPTT: 30 seconds (ref 24–36)

## 2021-05-26 LAB — SEDIMENTATION RATE: Sed Rate: 8 mm/hr (ref 0–22)

## 2021-05-26 LAB — PROTIME-INR
INR: 1 (ref 0.8–1.2)
Prothrombin Time: 13.6 seconds (ref 11.4–15.2)

## 2021-05-26 LAB — C-REACTIVE PROTEIN: CRP: 0.5 mg/dL (ref <1.0)

## 2021-05-26 NOTE — Progress Notes (Signed)
[place] [phone]   [phone]  INITIAL CONSULT NOTE  Patient Care Team: [name], MD as PCP - General (Family Medicine)  Hematological/Oncological History # Easy Bruising  CHIEF COMPLAINTS/PURPOSE OF CONSULTATION:  "Easy Bruising "  HISTORY OF PRESENTING ILLNESS:  Jill Elliott 45 y.o. female with medical history significant for asthma, eczema, headache, and anemia who presents for evaluation of easy bruising.   On review of the previous records Jill Elliott was referred by her primary care provider due to concern for easy bruising.  On exam today Jill Elliott reports that she has had easy bruising for at least the past 2 years.  She describes this bruising as "weird".  She notes that if she exercises or even walks there can be a burning sensation and then eventually she develops a "huge blood clot that is painful".  She notes that these bruises are quite large and almost always on the lower extremities.  She says it "takes forever to go away".  There are none of these currently on her legs today.  She does have some photographs of these lesions.  She does that they just occur on the legs with a single episode occurring on the shoulders.  She notes that the skin itches when these develop.  She does report some occasional gum bleeding but otherwise no bleeding from the nose, GYN bleeding, or rectal bleeding.    On further discussion there is no family history of bleeding disorders or bleeding issues.  Her mother had stage IV cervical cancer.  She is a never smoker and only socially drinks alcohol.  She currently works as an Art therapist for adults with disability.  She underwent a partial hysterectomy back in 2018 as she did have heavy menstrual cycles prior to that time.  She otherwise denies any fevers, chills, sweats, nausea, vomiting or diarrhea.  A full 10 point ROS is listed below.  MEDICAL HISTORY:  Past Medical History:  Diagnosis Date    Anemia    Asthma    as teenager, no problems as adult, no inhaler   Eczema    Headache    menstrual    Seasonal allergies    SVD (spontaneous vaginal delivery) 12/28/199   x 1 at 34 wks stillborn   Umbilical hernia     SURGICAL HISTORY: Past Surgical History:  Procedure Laterality Date   ABDOMINAL HYSTERECTOMY     BREAST SURGERY     reduction   COLONOSCOPY     DILATION AND CURETTAGE OF UTERUS     w/myomectomy   LASER ABLATION  2015   uterine   LIPOSUCTION TRUNK     tummy   MYOMECTOMY VAGINAL APPROACH     x 2   ROBOTIC ASSISTED TOTAL HYSTERECTOMY WITH SALPINGECTOMY Bilateral 11/18/2017   Procedure: ROBOTIC ASSISTED TOTAL HYSTERECTOMY WITH SALPINGECTOMY;  Surgeon: Delsa Bern, MD;  Location: [facility];  Service: Gynecology;  Laterality: Bilateral;   WISDOM TOOTH EXTRACTION      SOCIAL HISTORY: Social History   Socioeconomic History   Marital status: Single    Spouse name: Not on file   Number of children: Not on file   Years of education: Not on file   Highest education level: Not on file  Occupational History   Not on file  Tobacco Use   Smoking status: Never   Smokeless tobacco: Never  Vaping Use   Vaping Use: Never used  Substance and Sexual Activity   Alcohol use: No   Drug  use: No   Sexual activity: Not Currently    Birth control/protection: None, Surgical  Other Topics Concern   Not on file  Social History Narrative   Not on file   Social Determinants of Health   Financial Resource Strain: Not on file  Food Insecurity: Not on file  Transportation Needs: Not on file  Physical Activity: Not on file  Stress: Not on file  Social Connections: Not on file  Intimate Partner Violence: Not on file    FAMILY HISTORY: Family History  Problem Relation Age of Onset   Thyroid disease Mother    Allergic rhinitis Neg Hx    Angioedema Neg Hx    Asthma Neg Hx    Eczema Neg Hx    Immunodeficiency Neg Hx    Urticaria Neg Hx     ALLERGIES:  is allergic  to penicillins, azithromycin, fluconazole, and shellfish allergy.  MEDICATIONS:  Current Outpatient Medications  Medication Sig Dispense Refill   albuterol (VENTOLIN HFA) 108 (90 Base) MCG/ACT inhaler Inhale 2 puffs into the lungs every 4 (four) hours as needed. 18 g 1   azelastine (ASTELIN) 0.1 % nasal spray Place 2 sprays into both nostrils 2 (two) times daily. Use in each nostril as directed 30 mL 5   FLUoxetine (PROZAC) 10 MG capsule Take 10 mg by mouth daily.     fluticasone (FLONASE) 50 MCG/ACT nasal spray Place 2 sprays into both nostrils daily. 16 g 5   hydrOXYzine (VISTARIL) 25 MG capsule hydroxyzine pamoate 25 mg capsule  TAKE 1 CAPSULE BY MOUTH 3 TIMES A DAY AS NEEDED     levocetirizine (XYZAL) 5 MG tablet Take 5 mg by mouth as needed for allergies.     meclizine (ANTIVERT) 25 MG tablet 1 tablet as needed     prazosin (MINIPRESS) 1 MG capsule Take 1 mg by mouth at bedtime.     Probiotic Product (PROBIOTIC PO) Take 1 tablet by mouth daily.     traZODone (DESYREL) 50 MG tablet Take 50 mg by mouth at bedtime.     TURMERIC PO Take 1 tablet by mouth daily.     VITAMIN E PO Take 1 tablet by mouth daily.     No current facility-administered medications for this visit.    REVIEW OF SYSTEMS:   Constitutional: ( - ) fevers, ( - )  chills , ( - ) night sweats Eyes: ( - ) blurriness of vision, ( - ) double vision, ( - ) watery eyes Ears, nose, mouth, throat, and face: ( - ) mucositis, ( - ) sore throat Respiratory: ( - ) cough, ( - ) dyspnea, ( - ) wheezes Cardiovascular: ( - ) palpitation, ( - ) chest discomfort, ( - ) lower extremity swelling Gastrointestinal:  ( - ) nausea, ( - ) heartburn, ( - ) change in bowel habits Skin: ( - ) abnormal skin rashes Lymphatics: ( - ) new lymphadenopathy, ( - ) easy bruising Neurological: ( - ) numbness, ( - ) tingling, ( - ) new weaknesses Behavioral/Psych: ( - ) mood change, ( - ) new changes  All other systems were reviewed with the patient and  are negative.  PHYSICAL EXAMINATION: ECOG PERFORMANCE STATUS: 0 - Asymptomatic  Vitals:   05/26/21 1428  BP: 114/72  Pulse: 83  Resp: 20  Temp: (!) 97 F (36.1 C)  SpO2: 100%   Filed Weights   05/26/21 1428  Weight: 161 lb 4.8 oz (73.2 kg)    GENERAL: well  appearing middle-aged African-American female in no acute distress SKIN: skin color, texture, turgor are normal, no rashes or significant lesions EYES: conjunctiva are pink and non-injected, sclera clear LUNGS: clear to auscultation and percussion with normal breathing effort HEART: regular rate & rhythm and no murmurs and no lower extremity edema PSYCH: alert & oriented x 3, fluent speech NEURO: no focal motor/sensory deficits  LABORATORY DATA:  I have reviewed the data as listed CBC Latest Ref Rng & Units 05/26/2021 12/25/2020 12/06/2017  WBC 4.0 - 10.5 K/uL 4.4 7.5 4.8  Hemoglobin 12.0 - 15.0 g/dL 12.4 12.3 11.6(L)  Hematocrit 36.0 - 46.0 % 37.9 38.7 35.4(L)  Platelets 150 - 400 K/uL 315 264 333    CMP Latest Ref Rng & Units 05/26/2021 12/25/2020 11/16/2017  Glucose 70 - 99 mg/dL 83 84 89  BUN 6 - 20 mg/dL 9 5(L) 9  Creatinine 0.44 - 1.00 mg/dL 0.82 0.63 0.70  Sodium 135 - 145 mmol/L 138 137 137  Potassium 3.5 - 5.1 mmol/L 4.0 4.2 4.2  Chloride 98 - 111 mmol/L 105 107 104  CO2 22 - 32 mmol/L 25 23 24   Calcium 8.9 - 10.3 mg/dL 9.4 8.9 9.5  Total Protein 6.5 - 8.1 g/dL 7.6 7.0 -  Total Bilirubin 0.3 - 1.2 mg/dL 0.8 0.8 -  Alkaline Phos 38 - 126 U/L 57 54 -  AST 15 - 41 U/L 15 16 -  ALT 0 - 44 U/L 12 10 -    RADIOGRAPHIC STUDIES: No results found.  ASSESSMENT & PLAN Jill Elliott 45 y.o. female with medical history significant for asthma, eczema, headache, and anemia who presents for evaluation of easy bruising.   After review of the labs, review of the records, and discussion with the patient the patients findings are most consistent with benign bruising of the lower extremities.  At this time there are no  signs or symptoms of a more serious bleeding disorder.  In order to further assess this we will order PT, INR, and PTT.  In the event that the studies are normal there is no further investigation required.  Would recommend routine follow-up with patient's PCP.  #Easy Bruising -- We will order coagulation test today including PT, INR, and PTT. --If further evaluation is warranted could consider platelet aggregation studies --No other signs of easy bleeding and no family history of bleeding.  Patient is tolerated procedures well without excess bleeding. --Bruising is only occurring on the lower extremities.  This generally suggests a benign nonpathological cause. --If above labs are normal there is no need for return to clinic.  If abnormalities are noted we can continue to pursue work-up.  Orders Placed This Encounter  Procedures   CBC with Differential ([place] Only)    Standing Status:   Future    Number of Occurrences:   1    Standing Expiration Date:   05/26/2022   CMP ([place] only)    Standing Status:   Future    Number of Occurrences:   1    Standing Expiration Date:   05/26/2022   Protime-INR    Standing Status:   Future    Number of Occurrences:   1    Standing Expiration Date:   05/26/2022   APTT    Standing Status:   Future    Number of Occurrences:   1    Standing Expiration Date:   05/26/2022   Sedimentation rate    Standing Status:   Future  Number of Occurrences:   1    Standing Expiration Date:   05/26/2022   C-reactive protein    Standing Status:   Future    Number of Occurrences:   1    Standing Expiration Date:   05/26/2022    All questions were answered. The patient knows to call the clinic with any problems, questions or concerns.  A total of more than 60 minutes were spent on this encounter with face-to-face time and non-face-to-face time, including preparing to see the patient, ordering tests and/or medications, counseling the patient and  coordination of care as outlined above.   Ledell Peoples, MD Department of Hematology/Oncology [place] at [facility] Phone: [phone] Pager: [phone] Email: [email or web address]  06/01/2021 9:36 PM

## 2021-05-30 ENCOUNTER — Telehealth: Payer: Self-pay | Admitting: *Deleted

## 2021-05-30 NOTE — Telephone Encounter (Signed)
TCT patient regarding her recent lab results.  Spoke with pt and advised that her labs were normal, no evidence of a blood disorder that would cause bruising. The cause of her bruising is unclear.. Advised that she did not need to return to our clinic unless the brusing gets worse or she has changes in her future labs

## 2021-05-30 NOTE — Telephone Encounter (Signed)
-----   Message from Orson Slick, MD sent at 05/28/2021  1:28 PM EDT ----- Please let Mrs. Reisch know that we did not find a clear cause for her bruising based on our bloodwork. There are no findings to suggest she has an issue with her blood. The cause of her bruising is unclear. There is no need for routine f/u in our clinic unless she were to have worsening bruising/ other blood abnormalities.  ----- Message ----- From: Buel Ream, Lab [facility] [place] Sent: 05/26/2021   3:42 PM EDT To: Orson Slick, MD

## 2021-06-04 DIAGNOSIS — H6121 Impacted cerumen, right ear: Secondary | ICD-10-CM | POA: Diagnosis not present

## 2021-06-18 DIAGNOSIS — K581 Irritable bowel syndrome with constipation: Secondary | ICD-10-CM | POA: Diagnosis not present

## 2021-06-19 DIAGNOSIS — F4310 Post-traumatic stress disorder, unspecified: Secondary | ICD-10-CM | POA: Diagnosis not present

## 2021-06-20 DIAGNOSIS — F411 Generalized anxiety disorder: Secondary | ICD-10-CM | POA: Diagnosis not present

## 2021-06-20 DIAGNOSIS — F4310 Post-traumatic stress disorder, unspecified: Secondary | ICD-10-CM | POA: Diagnosis not present

## 2021-06-20 DIAGNOSIS — F332 Major depressive disorder, recurrent severe without psychotic features: Secondary | ICD-10-CM | POA: Diagnosis not present

## 2021-06-23 DIAGNOSIS — Z20822 Contact with and (suspected) exposure to covid-19: Secondary | ICD-10-CM | POA: Diagnosis not present

## 2021-06-24 DIAGNOSIS — B349 Viral infection, unspecified: Secondary | ICD-10-CM | POA: Diagnosis not present

## 2021-07-01 DIAGNOSIS — F4310 Post-traumatic stress disorder, unspecified: Secondary | ICD-10-CM | POA: Diagnosis not present

## 2021-07-02 DIAGNOSIS — J019 Acute sinusitis, unspecified: Secondary | ICD-10-CM | POA: Diagnosis not present

## 2021-07-02 DIAGNOSIS — J309 Allergic rhinitis, unspecified: Secondary | ICD-10-CM | POA: Diagnosis not present

## 2021-07-07 DIAGNOSIS — F411 Generalized anxiety disorder: Secondary | ICD-10-CM | POA: Diagnosis not present

## 2021-07-07 DIAGNOSIS — F332 Major depressive disorder, recurrent severe without psychotic features: Secondary | ICD-10-CM | POA: Diagnosis not present

## 2021-07-07 DIAGNOSIS — F4310 Post-traumatic stress disorder, unspecified: Secondary | ICD-10-CM | POA: Diagnosis not present

## 2021-07-11 DIAGNOSIS — Z0000 Encounter for general adult medical examination without abnormal findings: Secondary | ICD-10-CM | POA: Diagnosis not present

## 2021-07-11 DIAGNOSIS — Z23 Encounter for immunization: Secondary | ICD-10-CM | POA: Diagnosis not present

## 2021-07-15 DIAGNOSIS — F4310 Post-traumatic stress disorder, unspecified: Secondary | ICD-10-CM | POA: Diagnosis not present

## 2021-07-22 DIAGNOSIS — F332 Major depressive disorder, recurrent severe without psychotic features: Secondary | ICD-10-CM | POA: Diagnosis not present

## 2021-07-22 DIAGNOSIS — F411 Generalized anxiety disorder: Secondary | ICD-10-CM | POA: Diagnosis not present

## 2021-07-22 DIAGNOSIS — F4310 Post-traumatic stress disorder, unspecified: Secondary | ICD-10-CM | POA: Diagnosis not present

## 2021-07-29 DIAGNOSIS — F411 Generalized anxiety disorder: Secondary | ICD-10-CM | POA: Diagnosis not present

## 2021-07-29 DIAGNOSIS — F4310 Post-traumatic stress disorder, unspecified: Secondary | ICD-10-CM | POA: Diagnosis not present

## 2021-07-29 DIAGNOSIS — F332 Major depressive disorder, recurrent severe without psychotic features: Secondary | ICD-10-CM | POA: Diagnosis not present

## 2021-08-28 DIAGNOSIS — F4310 Post-traumatic stress disorder, unspecified: Secondary | ICD-10-CM | POA: Diagnosis not present

## 2021-10-24 ENCOUNTER — Other Ambulatory Visit: Payer: Self-pay | Admitting: Allergy

## 2021-11-30 IMAGING — CT CT ABD-PELV W/ CM
2 of 4 series · 16 of 46 positions shown, 18 images · IV contrast (Omni 300)
Comparison: Ct abd/pelvis 11/28/17

CLINICAL DATA: Sudden onset lower abdominal pain in the middle of
the night. History of hysterectomy. Diverticulitis suspected.

EXAM:
CT ABDOMEN AND PELVIS WITH CONTRAST
TECHNIQUE: Multidetector CT imaging of the abdomen and pelvis was performed
using the standard protocol following bolus administration of
intravenous contrast.
CONTRAST:  100mL OMNIPAQUE IOHEXOL 300 MG/ML  SOLN

[Series 3: a/p w/ 5mm · axial · 0.78mm/px · z∈[+729,+1139]mm · 13 of 90 slices shown, 15 images]
[im 4/90  soft-tissue]
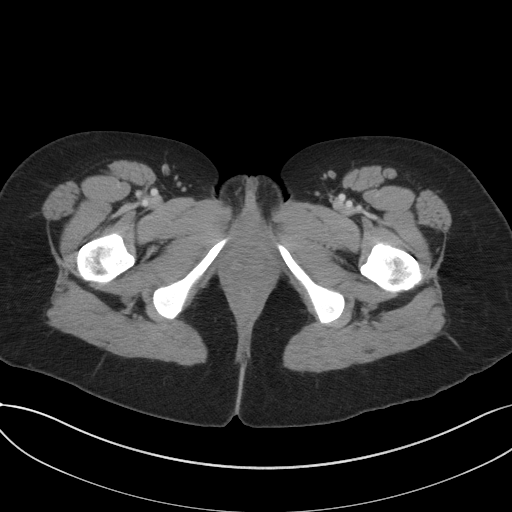
[im 4/90  bone]
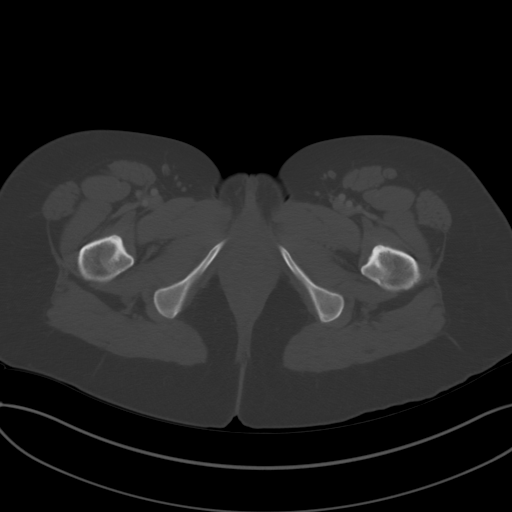
[im 12/90  soft-tissue]
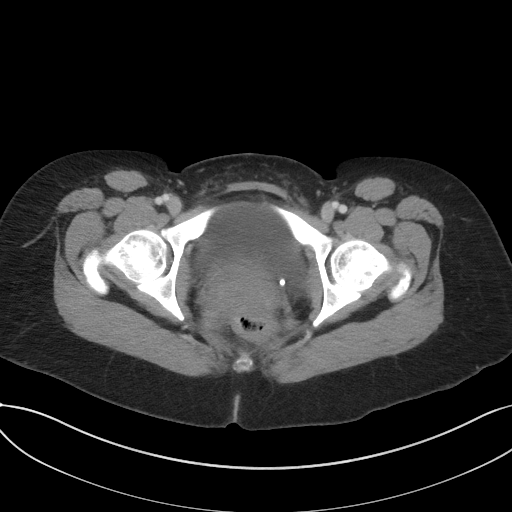
[im 20/90  soft-tissue]
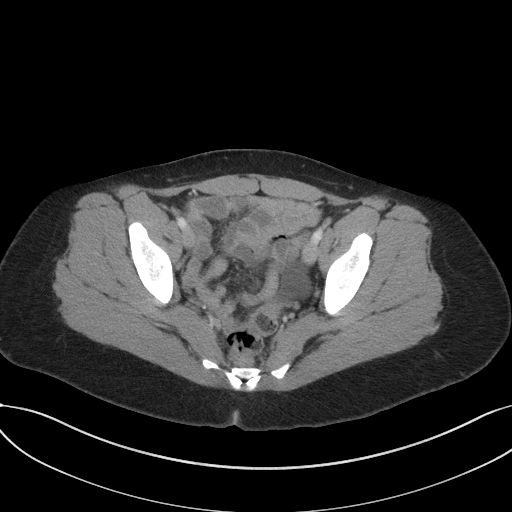
[im 24/90  soft-tissue]
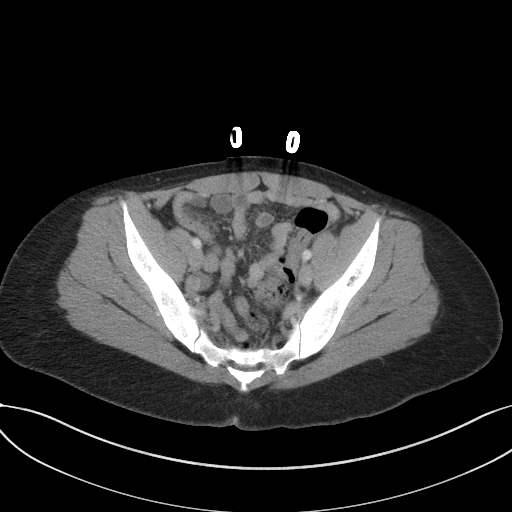
[im 31/90  soft-tissue]
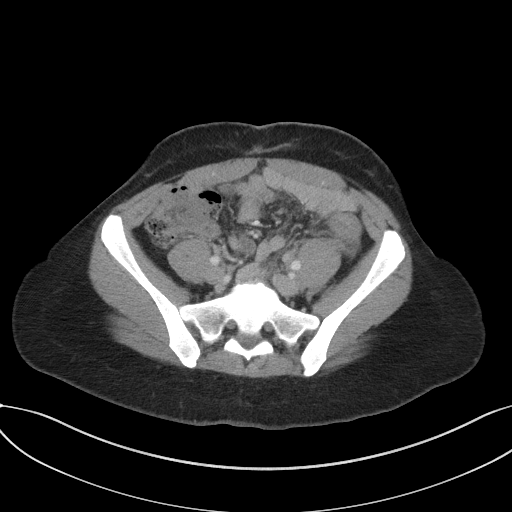
[im 39/90  soft-tissue]
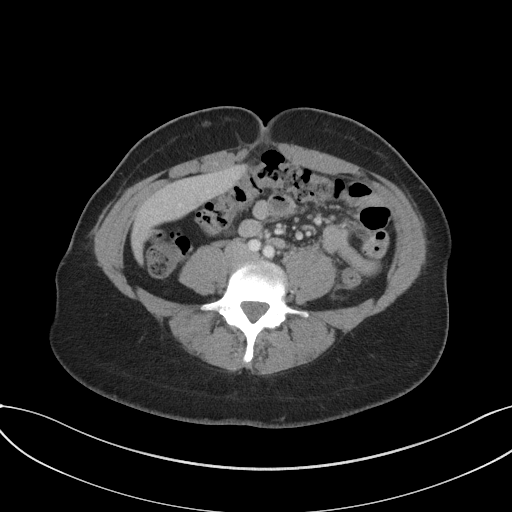
[im 47/90  soft-tissue]
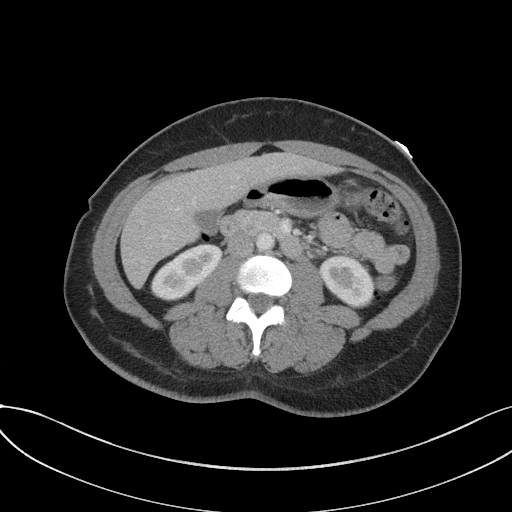
[im 51/90  soft-tissue]
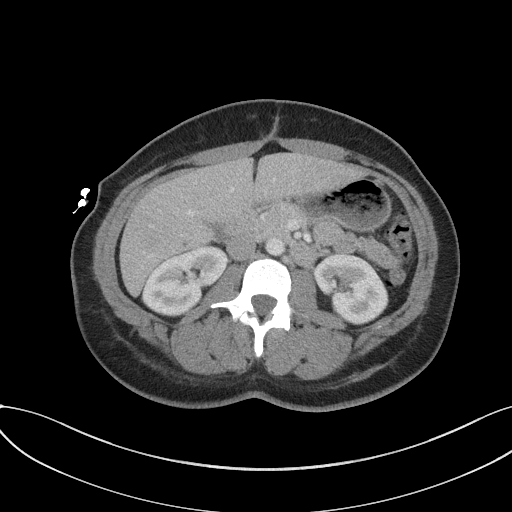
[im 59/90  soft-tissue]
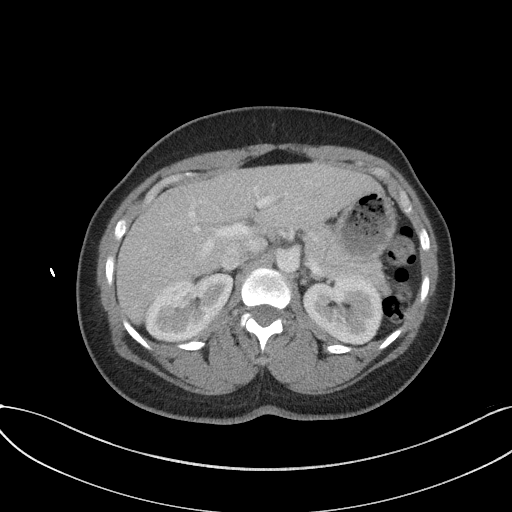
[im 59/90  bone]
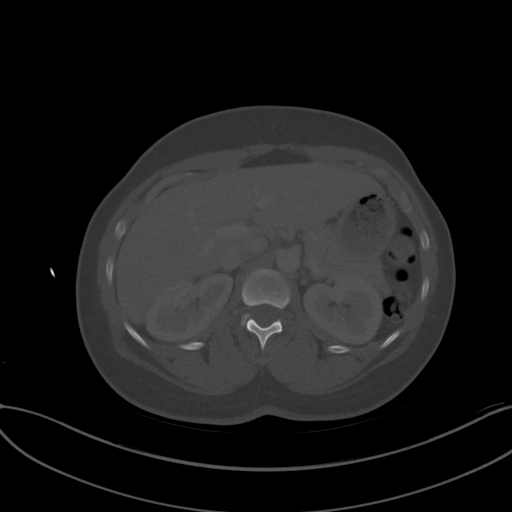
[im 66/90  soft-tissue]
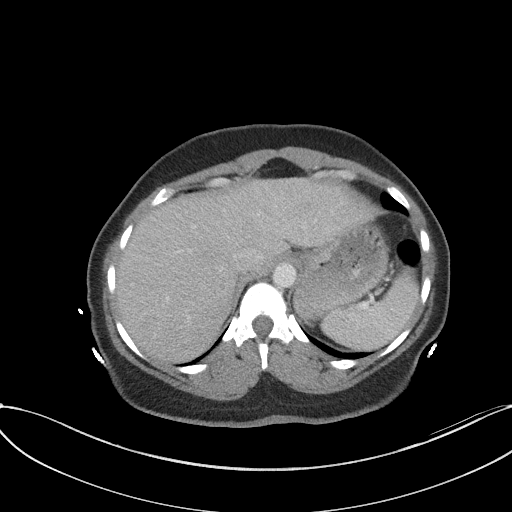
[im 70/90  soft-tissue]
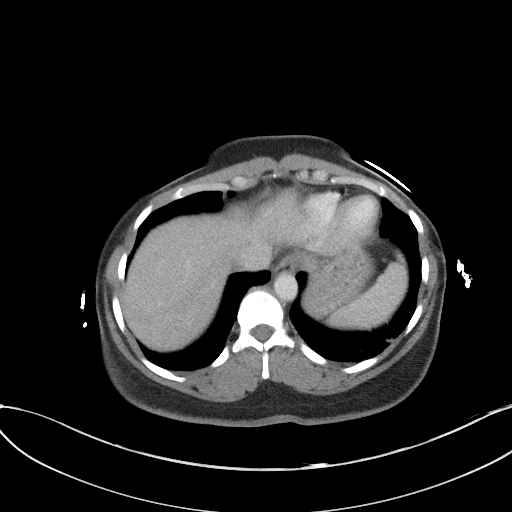
[im 78/90  soft-tissue]
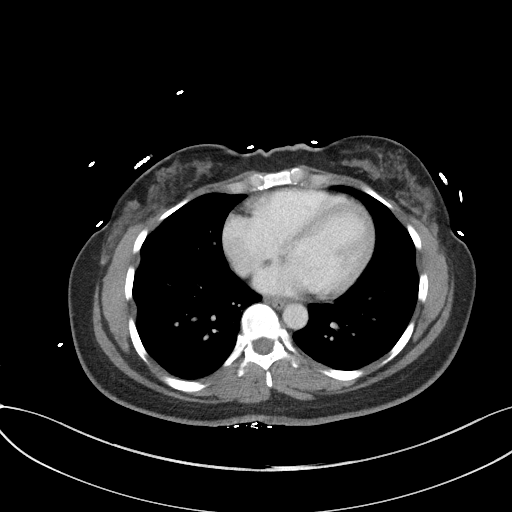
[im 86/90  soft-tissue]
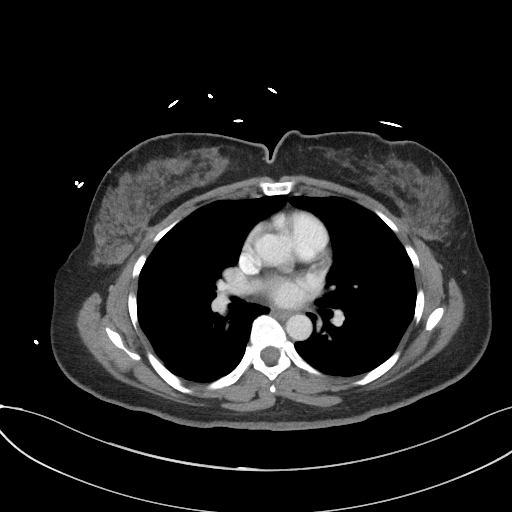

[Series 6: a/p w/ cor · coronal · 0.63mm/px · 3 of 123 slices shown]
[im 41/123  soft-tissue]
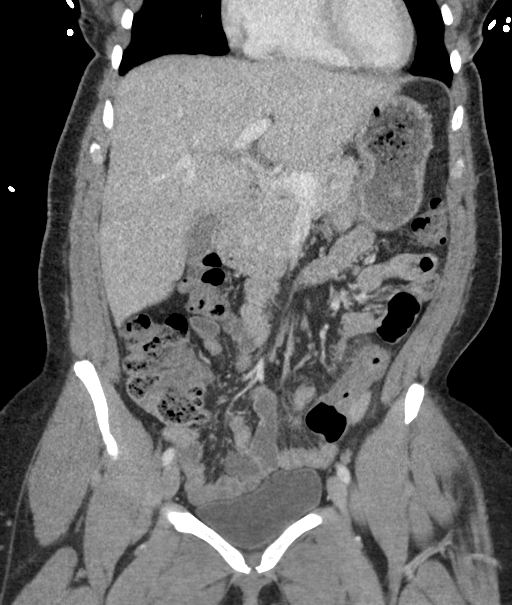
[im 55/123  soft-tissue]
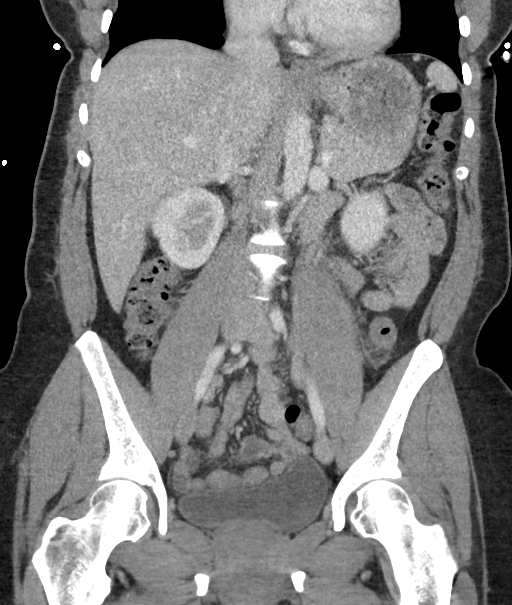
[im 68/123  soft-tissue]
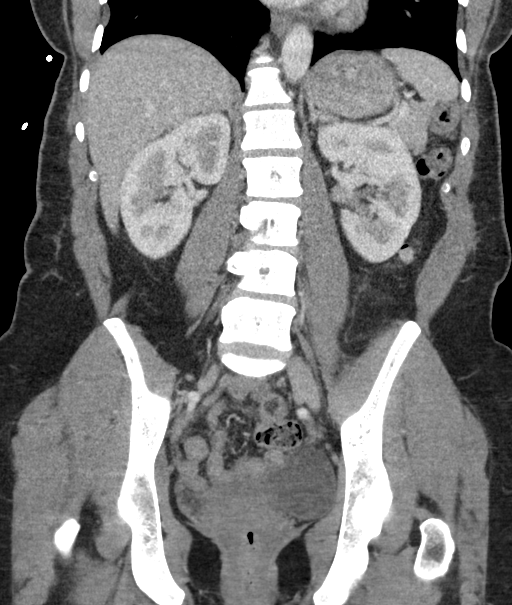

[16 of 46 positions shown; findings below may reference images not displayed]

FINDINGS: Lower chest: Bilateral lower lobe subsegmental atelectasis.

Hepatobiliary: No focal liver abnormality. No gallstones,
gallbladder wall thickening, or pericholecystic fluid. No biliary
dilatation.

Pancreas: Unremarkable. No pancreatic ductal dilatation or
surrounding inflammatory changes.

Spleen: Normal in size without focal abnormality.

Adrenals/Urinary Tract: Adrenal glands are unremarkable. Kidneys are
normal, without renal calculi, focal lesion, or hydronephrosis.
Urinary bladder is unremarkable.

Stomach/Bowel: Stomach is within normal limits. Appendix appears
normal. No evidence of small bowel wall thickening, distention, or
inflammatory changes. Few scattered colonic diverticula. Tiny
diverticula along the distal descending colon/sigmoid colon junction
with associated pericolonic fat stranding.

Vascular/Lymphatic: No significant vascular findings are present. No
enlarged abdominal or pelvic lymph nodes.

Reproductive: Status post hysterectomy. No adnexal masses.

Other: No intraperitoneal free fluid. No intraperitoneal free gas.
No organized fluid collection.

Musculoskeletal: No acute or significant osseous findings.
IMPRESSION: Few scattered colonic diverticulosis with mild/early uncomplicated
acute diverticulitis of the distal descending/sigmoid colon
junction.
